# Patient Record
Sex: Male | Born: 1935 | Race: White | Hispanic: No | Smoking: Former smoker
Health system: Southern US, Community
[De-identification: ages and names within clinical notes are randomized; demographics above are authoritative.]

## PROBLEM LIST (undated history)

## (undated) DIAGNOSIS — I10 Essential (primary) hypertension: Secondary | ICD-10-CM

## (undated) DIAGNOSIS — I214 Non-ST elevation (NSTEMI) myocardial infarction: Secondary | ICD-10-CM

## (undated) DIAGNOSIS — J45909 Unspecified asthma, uncomplicated: Secondary | ICD-10-CM

## (undated) DIAGNOSIS — I25810 Atherosclerosis of coronary artery bypass graft(s) without angina pectoris: Secondary | ICD-10-CM

## (undated) DIAGNOSIS — I252 Old myocardial infarction: Secondary | ICD-10-CM

## (undated) DIAGNOSIS — K219 Gastro-esophageal reflux disease without esophagitis: Secondary | ICD-10-CM

## (undated) DIAGNOSIS — E785 Hyperlipidemia, unspecified: Secondary | ICD-10-CM

## (undated) HISTORY — DX: Unspecified asthma, uncomplicated: J45.909

## (undated) HISTORY — PX: CORONARY ARTERY BYPASS GRAFT: SHX141

## (undated) HISTORY — DX: Hyperlipidemia, unspecified: E78.5

## (undated) HISTORY — DX: Non-ST elevation (NSTEMI) myocardial infarction: I21.4

## (undated) HISTORY — DX: Essential (primary) hypertension: I10

## (undated) HISTORY — PX: CHOLECYSTECTOMY: SHX55

## (undated) HISTORY — DX: Atherosclerosis of coronary artery bypass graft(s) without angina pectoris: I25.810

## (undated) HISTORY — DX: Old myocardial infarction: I25.2

## (undated) HISTORY — DX: Gastro-esophageal reflux disease without esophagitis: K21.9

---

## 2000-02-21 ENCOUNTER — Encounter: Payer: Self-pay | Admitting: General Surgery

## 2000-02-21 ENCOUNTER — Encounter: Admission: RE | Admit: 2000-02-21 | Discharge: 2000-02-21 | Payer: Self-pay | Admitting: General Surgery

## 2000-02-22 ENCOUNTER — Ambulatory Visit (HOSPITAL_BASED_OUTPATIENT_CLINIC_OR_DEPARTMENT_OTHER): Admission: RE | Admit: 2000-02-22 | Discharge: 2000-02-22 | Payer: Self-pay | Admitting: General Surgery

## 2007-03-28 DIAGNOSIS — I214 Non-ST elevation (NSTEMI) myocardial infarction: Secondary | ICD-10-CM

## 2007-03-28 HISTORY — DX: Non-ST elevation (NSTEMI) myocardial infarction: I21.4

## 2008-08-10 DIAGNOSIS — E785 Hyperlipidemia, unspecified: Secondary | ICD-10-CM

## 2008-08-10 DIAGNOSIS — J45909 Unspecified asthma, uncomplicated: Secondary | ICD-10-CM | POA: Insufficient documentation

## 2008-08-10 DIAGNOSIS — I252 Old myocardial infarction: Secondary | ICD-10-CM | POA: Insufficient documentation

## 2008-08-11 DIAGNOSIS — I25810 Atherosclerosis of coronary artery bypass graft(s) without angina pectoris: Secondary | ICD-10-CM | POA: Insufficient documentation

## 2008-08-11 DIAGNOSIS — I10 Essential (primary) hypertension: Secondary | ICD-10-CM

## 2008-08-12 ENCOUNTER — Ambulatory Visit: Payer: Self-pay | Admitting: Cardiology

## 2008-08-12 LAB — CONVERTED CEMR LAB
BUN: 17 mg/dL (ref 6–23)
Basophils Absolute: 0 10*3/uL (ref 0.0–0.1)
CO2: 29 meq/L (ref 19–32)
Calcium: 9.2 mg/dL (ref 8.4–10.5)
Creatinine, Ser: 0.8 mg/dL (ref 0.4–1.5)
Eosinophils Absolute: 0.2 10*3/uL (ref 0.0–0.7)
GFR calc non Af Amer: 100.13 mL/min (ref >60)
Glucose, Bld: 122 mg/dL — ABNORMAL HIGH (ref 70–99)
Hemoglobin: 16.5 g/dL (ref 13.0–17.0)
Lymphocytes Relative: 16.2 % (ref 12.0–46.0)
Lymphs Abs: 1 10*3/uL (ref 0.7–4.0)
MCHC: 34.5 g/dL (ref 30.0–36.0)
Monocytes Relative: 7.7 % (ref 3.0–12.0)
Neutro Abs: 4.2 10*3/uL (ref 1.4–7.7)
Platelets: 144 10*3/uL — ABNORMAL LOW (ref 150.0–400.0)
Prothrombin Time: 11.1 s (ref 10.9–13.3)
RDW: 12.2 % (ref 11.5–14.6)
Sodium: 142 meq/L (ref 135–145)
aPTT: 28 s (ref 21.7–28.8)

## 2008-08-13 ENCOUNTER — Ambulatory Visit: Payer: Self-pay | Admitting: Surgery

## 2008-08-13 ENCOUNTER — Inpatient Hospital Stay (HOSPITAL_BASED_OUTPATIENT_CLINIC_OR_DEPARTMENT_OTHER): Admission: RE | Admit: 2008-08-13 | Discharge: 2008-08-13 | Payer: Self-pay | Admitting: Cardiology

## 2008-08-13 ENCOUNTER — Ambulatory Visit: Payer: Self-pay | Admitting: Cardiology

## 2008-08-19 ENCOUNTER — Encounter: Payer: Self-pay | Admitting: Surgery

## 2008-08-21 ENCOUNTER — Ambulatory Visit: Payer: Self-pay | Admitting: Surgery

## 2008-08-21 ENCOUNTER — Ambulatory Visit: Payer: Self-pay | Admitting: Cardiology

## 2008-08-21 ENCOUNTER — Inpatient Hospital Stay (HOSPITAL_COMMUNITY): Admission: RE | Admit: 2008-08-21 | Discharge: 2008-08-26 | Payer: Self-pay | Admitting: Surgery

## 2008-09-14 ENCOUNTER — Encounter: Payer: Self-pay | Admitting: Cardiology

## 2008-09-14 ENCOUNTER — Ambulatory Visit: Payer: Self-pay | Admitting: Surgery

## 2008-09-14 ENCOUNTER — Encounter: Admission: RE | Admit: 2008-09-14 | Discharge: 2008-09-14 | Payer: Self-pay | Admitting: Surgery

## 2008-10-12 ENCOUNTER — Ambulatory Visit: Payer: Self-pay | Admitting: Cardiology

## 2010-07-04 LAB — BASIC METABOLIC PANEL
BUN: 16 mg/dL (ref 6–23)
CO2: 31 mEq/L (ref 19–32)
Calcium: 8.9 mg/dL (ref 8.4–10.5)
Chloride: 104 mEq/L (ref 96–112)
Creatinine, Ser: 0.76 mg/dL (ref 0.4–1.5)

## 2010-07-04 LAB — CBC
MCHC: 34.3 g/dL (ref 30.0–36.0)
MCV: 89.5 fL (ref 78.0–100.0)
Platelets: 145 10*3/uL — ABNORMAL LOW (ref 150–400)

## 2010-07-05 LAB — BLOOD GAS, ARTERIAL
Drawn by: 313941
FIO2: 0.21 %
O2 Saturation: 98.1 %
Patient temperature: 98.6
pH, Arterial: 7.413 (ref 7.350–7.450)

## 2010-07-05 LAB — PROTIME-INR: Prothrombin Time: 16.4 seconds — ABNORMAL HIGH (ref 11.6–15.2)

## 2010-07-05 LAB — HEPATIC FUNCTION PANEL
ALT: 18 U/L (ref 0–53)
AST: 26 U/L (ref 0–37)
Albumin: 3.4 g/dL — ABNORMAL LOW (ref 3.5–5.2)
Alkaline Phosphatase: 48 U/L (ref 39–117)
Bilirubin, Direct: 0.3 mg/dL (ref 0.0–0.3)
Indirect Bilirubin: 0.7 mg/dL (ref 0.3–0.9)
Total Bilirubin: 1 mg/dL (ref 0.3–1.2)
Total Protein: 5.6 g/dL — ABNORMAL LOW (ref 6.0–8.3)

## 2010-07-05 LAB — BASIC METABOLIC PANEL
BUN: 12 mg/dL (ref 6–23)
BUN: 12 mg/dL (ref 6–23)
Calcium: 9.1 mg/dL (ref 8.4–10.5)
Chloride: 104 mEq/L (ref 96–112)
Creatinine, Ser: 0.74 mg/dL (ref 0.4–1.5)
Creatinine, Ser: 0.78 mg/dL (ref 0.4–1.5)
GFR calc non Af Amer: >60 mL/min (ref >60)
GFR calc non Af Amer: >60 mL/min (ref >60)
Glucose, Bld: 109 mg/dL — ABNORMAL HIGH (ref 70–99)
Potassium: 4.9 mEq/L (ref 3.5–5.1)

## 2010-07-05 LAB — TYPE AND SCREEN

## 2010-07-05 LAB — URINALYSIS, ROUTINE W REFLEX MICROSCOPIC
Ketones, ur: NEGATIVE mg/dL
Nitrite: NEGATIVE
Protein, ur: NEGATIVE mg/dL
pH: 7 (ref 5.0–8.0)

## 2010-07-05 LAB — POCT I-STAT 3, ART BLOOD GAS (G3+)
Acid-base deficit: 1 mmol/L (ref 0.0–2.0)
Bicarbonate: 22.2 mEq/L (ref 20.0–24.0)
O2 Saturation: 100 %
O2 Saturation: 98 %
O2 Saturation: 99 %
Patient temperature: 35.9
Patient temperature: 36.5
TCO2: 23 mmol/L (ref 0–100)
TCO2: 26 mmol/L (ref 0–100)
TCO2: 29 mmol/L (ref 0–100)
pCO2 arterial: 40.6 mmHg (ref 35.0–45.0)
pO2, Arterial: 131 mmHg — ABNORMAL HIGH (ref 80.0–100.0)

## 2010-07-05 LAB — CBC
HCT: 38.2 % — ABNORMAL LOW (ref 39.0–52.0)
HCT: 39.2 % (ref 39.0–52.0)
HCT: 44.8 % (ref 39.0–52.0)
Hemoglobin: 13.4 g/dL (ref 13.0–17.0)
MCHC: 34.1 g/dL (ref 30.0–36.0)
MCV: 89.7 fL (ref 78.0–100.0)
MCV: 89.9 fL (ref 78.0–100.0)
MCV: 90.5 fL (ref 78.0–100.0)
MCV: 90.6 fL (ref 78.0–100.0)
Platelets: 104 10*3/uL — ABNORMAL LOW (ref 150–400)
Platelets: 111 10*3/uL — ABNORMAL LOW (ref 150–400)
Platelets: 168 10*3/uL (ref 150–400)
Platelets: 97 10*3/uL — ABNORMAL LOW (ref 150–400)
RBC: 4.32 MIL/uL (ref 4.22–5.81)
RBC: 5 MIL/uL (ref 4.22–5.81)
RDW: 12.5 % (ref 11.5–15.5)
RDW: 12.6 % (ref 11.5–15.5)
RDW: 12.7 % (ref 11.5–15.5)
WBC: 10.1 10*3/uL (ref 4.0–10.5)
WBC: 11 10*3/uL — ABNORMAL HIGH (ref 4.0–10.5)
WBC: 12.3 10*3/uL — ABNORMAL HIGH (ref 4.0–10.5)

## 2010-07-05 LAB — GLUCOSE, CAPILLARY
Glucose-Capillary: 107 mg/dL — ABNORMAL HIGH (ref 70–99)
Glucose-Capillary: 112 mg/dL — ABNORMAL HIGH (ref 70–99)
Glucose-Capillary: 122 mg/dL — ABNORMAL HIGH (ref 70–99)
Glucose-Capillary: 91 mg/dL (ref 70–99)
Glucose-Capillary: 98 mg/dL (ref 70–99)

## 2010-07-05 LAB — POCT I-STAT, CHEM 8
BUN: 13 mg/dL (ref 6–23)
Calcium, Ion: 1.06 mmol/L — ABNORMAL LOW (ref 1.12–1.32)
Chloride: 109 mEq/L (ref 96–112)
HCT: 38 % — ABNORMAL LOW (ref 39.0–52.0)
Potassium: 4.5 mEq/L (ref 3.5–5.1)
Sodium: 141 mEq/L (ref 135–145)

## 2010-07-05 LAB — CREATININE, SERUM
Creatinine, Ser: 0.71 mg/dL (ref 0.4–1.5)
Creatinine, Ser: 0.87 mg/dL (ref 0.4–1.5)
GFR calc Af Amer: >60 mL/min (ref >60)
GFR calc non Af Amer: >60 mL/min (ref >60)

## 2010-07-05 LAB — POCT I-STAT 4, (NA,K, GLUC, HGB,HCT)
Glucose, Bld: 106 mg/dL — ABNORMAL HIGH (ref 70–99)
Glucose, Bld: 113 mg/dL — ABNORMAL HIGH (ref 70–99)
Glucose, Bld: 130 mg/dL — ABNORMAL HIGH (ref 70–99)
Glucose, Bld: 99 mg/dL (ref 70–99)
HCT: 34 % — ABNORMAL LOW (ref 39.0–52.0)
HCT: 43 % (ref 39.0–52.0)
HCT: 48 % (ref 39.0–52.0)
Hemoglobin: 11.2 g/dL — ABNORMAL LOW (ref 13.0–17.0)
Hemoglobin: 16.3 g/dL (ref 13.0–17.0)
Potassium: 3.9 mEq/L (ref 3.5–5.1)
Potassium: 4.2 mEq/L (ref 3.5–5.1)
Sodium: 141 mEq/L (ref 135–145)
Sodium: 142 mEq/L (ref 135–145)

## 2010-07-05 LAB — MAGNESIUM
Magnesium: 2 mg/dL (ref 1.5–2.5)
Magnesium: 2.2 mg/dL (ref 1.5–2.5)

## 2010-07-05 LAB — COMPREHENSIVE METABOLIC PANEL
ALT: 18 U/L (ref 0–53)
AST: 22 U/L (ref 0–37)
Albumin: 4 g/dL (ref 3.5–5.2)
Alkaline Phosphatase: 74 U/L (ref 39–117)
BUN: 12 mg/dL (ref 6–23)
GFR calc Af Amer: >60 mL/min (ref >60)
Potassium: 4.8 mEq/L (ref 3.5–5.1)
Sodium: 139 mEq/L (ref 135–145)
Total Protein: 6.8 g/dL (ref 6.0–8.3)

## 2010-07-05 LAB — AMYLASE: Amylase: 31 U/L (ref 27–131)

## 2010-07-05 LAB — APTT
aPTT: 26 seconds (ref 24–37)
aPTT: 33 seconds (ref 24–37)

## 2010-07-05 LAB — HEMOGLOBIN AND HEMATOCRIT, BLOOD
HCT: 36.1 % — ABNORMAL LOW (ref 39.0–52.0)
Hemoglobin: 12 g/dL — ABNORMAL LOW (ref 13.0–17.0)

## 2010-07-05 LAB — LIPASE, BLOOD: Lipase: 15 U/L (ref 11–59)

## 2010-07-05 LAB — HEMOGLOBIN A1C: Mean Plasma Glucose: 120 mg/dL

## 2010-07-19 ENCOUNTER — Encounter (INDEPENDENT_AMBULATORY_CARE_PROVIDER_SITE_OTHER): Payer: Self-pay

## 2010-07-19 DIAGNOSIS — R0989 Other specified symptoms and signs involving the circulatory and respiratory systems: Secondary | ICD-10-CM

## 2010-08-09 NOTE — Consult Note (Signed)
NEW PATIENT CONSULTATION   Ricardo Reed, Ricardo Reed  DOB:  12/16/1931                                        Aug 13, 2008  CHART #:  [id number]   REASON FOR CONSULTATION:  Left main and severe native three-vessel  coronary artery disease with saphenous vein graft occlusions and  progressive angina.   CLINICAL HISTORY:  I was asked by Dr. Juanda Chance to evaluate the patient for  consideration of redo coronary artery bypass graft surgery.  He is a 77-  year-old gentleman in good overall health, who underwent coronary artery  bypass surgery in [place], [place] in 1981.  The exact details of  that surgery are not clear to me, but he did have 3 vein grafts placed  and one of them was to the LAD.  He was doing well until March 2009 when  he suffered a non-ST segment elevation MI.  Cardiac catheterization at  that time showed patent vein graft to the LAD with 20-30% stenosis and  other 2 vein grafts were occluded.  The native left circumflex was  occluded with the distal vessel filling by collaterals.  His right  coronary artery was a nondominant vessel.  He said that he has had  occasional episodes of chest discomfort over the past year and then he  underwent a stress test in March 2010 that showed significant ST and T-  wave changes as well as inferior scar and inferolateral scar with some  periscar ischemia.  He denied any chest pain with stress test.  He said  that since the stress test, he has noticed increasing symptoms of  substernal chest discomfort radiating towards the right shoulder and arm  with some numbness.  He also has had some indigestion-type symptoms.  He  said these symptoms always occur with exertion, usually when he is  walking his 2-mile walk in the morning.  They usually occur after he  walks a couple of 100 yards and then he slows down and they resolve  after a few minutes, and then he can continue walking and finishing the  2-mile walk.  He has noticed  some decrease in his energy level.  He has  had no symptoms at rest.  He underwent cardiac catheterization today by  Dr. Juanda Chance, which I reviewed.  This shows diffuse left main disease  probably with 70-80% stenosis.  The LAD is occluded proximally at the  septal perforator with filling of the distal LAD by vein graft.  This  vein graft is diffusely diseased, but only has about 30% stenosis at the  ostium and another lesion in the midportion.  The LAD is a large vessel  that rest around the apex.  There is a large intermediate vessel  supplied by the left main.  The left circumflex is occluded proximally  with filling of a marginal vessel, a moderate-sized by bridging  collaterals.  The right coronary artery was a small nondominant vessel  that was occluded proximally with very faint filling of some tiny  branches distally.  Left ventricular ejection fraction is probably about  50% with inferior akinesis.  There is no significant mitral  regurgitation.  There does appear to be a small amount of calcium in the  aortic root as well as some calcification in the lateral pericardium.   REVIEW OF SYSTEMS:  His  review of systems is as follows.  General:  He  denies any fever or chills.  He has had no recent weight changes.  He  has had some fatigue.  Eyes:  Negative.  ENT:  Negative.  Endocrine:  He  denies diabetes and hypothyroidism.  Cardiovascular:  As above.  He  denies exertional dyspnea.  He has had no PND or orthopnea.  He denies  palpitations and peripheral edema.  Respiratory:  He does have a history  of asthma and wheezing.  He denies any cough or sputum production.  GI:  He has had no nausea or vomiting.  He denies melena and bright red blood  per rectum.  GU:  Denies dysuria and hematuria.  Vascular:  Denies  claudication and phlebitis.  Neurological:  He denies any focal weakness  or numbness.  He denies dizziness and syncope.  He has never had TIA or  stroke.  Musculoskeletal:  He  denies arthralgias and myalgias.  Psychiatric:  Negative.  Hematological:  Negative.   ALLERGIES:  None.   His medications are Proventil 108 mcg p.r.n., Prilosec OTC 20 mg daily,  pravastatin 10 mg nightly, Flonase 50 mcg daily, lisinopril 5 mg daily,  aspirin 81 mg daily, Imdur 30 mg daily, Benadryl nightly p.r.n., Asmanex  inhaler, and Aleve p.r.n.   His past medical history is significant for hypertension.  He has a  history of coronary artery disease as mentioned above.  There is no  history of diabetes.  His previous surgical history is significant for  TURP in the past and cholecystectomy in the past.   SOCIAL HISTORY:  He is widowed and has 3 adult children.  He is retired  from [employer].  He is a previous heavy smoker, but quit  in 1981.  He denies alcohol abuse.   FAMILY HISTORY:  Positive for cardiac disease.  His brother had heart  attack at 49 and his mother had heart attack at older age.   PHYSICAL EXAMINATION:  VITAL SIGNS:  His blood pressure is 171/78.  His  pulse is 52 and regular.  Respiratory rate is 18 and unlabored.  Oxygen  saturation on room air is 95%.  GENERAL:  He is a well-developed, elderly gentleman in no distress.  HEENT:  Normocephalic and atraumatic.  Pupils are equal and reactive to  light and accommodation.  Extraocular muscles are intact.  Throat is  clear.  NECK:  Normal carotid pulses bilaterally.  There are no bruits.  There  is no adenopathy or thyromegaly.  CARDIAC:  Regular rate and rhythm with normal S1 and S2.  There is no  murmur, rub, or gallop.  There is a well healed sternotomy scar.  The  sternum is stable.  LUNGS:  Clear.  ABDOMEN:  Active bowel sounds.  Abdomen is soft, flat, and nontender.  There are no palpable masses or organomegaly.  EXTREMITIES:  No peripheral edema.  Pedal pulses are palpable  bilaterally.  There is a saphenous vein harvest incision from the right  ankle to the mid thigh.  NEUROLOGIC:  He  is alert and oriented x3.  Motor and sensory exams  grossly normal.   IMPRESSION:  The patient has severe left main and native three-vessel  coronary artery disease with a patent saphenous vein graft to the left  anterior descending with mild stenosis, but diffuse disease as well as  two other occluded vein grafts.  He has worsening symptoms of exertional  angina and fatigue as  well as abnormal stress test showing inferior and  inferolateral scar and peri-infarct ischemia.  Given the above findings,  I think the best option for him is redo coronary artery bypass graft  surgery to prevent further ischemia and infarction, and improve his  quality of life.  I discussed the operative procedure with the patient  and his brother Dr. Lynnette Caffey, who is a retired Marine scientist from [employer].  I discussed alternatives, benefits, and risks including but not  limited to bleeding, blood transfusion, infection, stroke, myocardial  infarction, graft failure, and death.  He understands and agrees to  proceed.  We will plan to do this on Friday, Aug 21, 2008.   Evelene Croon, M.D.  Electronically Signed   BB/MEDQ  D:  08/13/2008  T:  08/14/2008  Job:  [id number]   cc:   Everardo Beals. Juanda Chance, MD, [facility]  Bearl Mulberry, MD

## 2010-08-09 NOTE — Op Note (Signed)
NAME:  Ricardo Reed, Ricardo Reed NO.:  [id number]   MEDICAL RECORD NO.:  [id number]          PATIENT TYPE:  INP   LOCATION:  2301                         FACILITY:  [facility]   PHYSICIAN:  Evelene Croon, M.D.     DATE OF BIRTH:  Nov 08, 1931   DATE OF PROCEDURE:  08/21/2008  DATE OF DISCHARGE:                               OPERATIVE REPORT   PREOPERATIVE DIAGNOSES:  Severe native three-vessel coronary artery  disease and saphenous vein graft disease, status post coronary artery  bypass graft surgery in 1981.   POSTOPERATIVE DIAGNOSIS:  Severe native three-vessel coronary artery  disease and saphenous vein graft disease, status post coronary artery  bypass graft surgery in 1981.   OPERATIVE PROCEDURES:  Redo median sternotomy, extracorporeal  circulation, redo coronary artery bypass graft surgery x3 using a left  internal mammary artery graft to the left anterior descending coronary  artery, with a saphenous vein graft to the diagonal branch of the left  anterior descending, and a saphenous vein graft to the obtuse marginal  branch of the left circumflex coronary artery.  Endoscopic vein  harvesting from the left leg.   ATTENDING SURGEON:  Evelene Croon, MD   ASSISTANT:  Stephanie Acre. Dasovich, PA-C   ANESTHESIA:  General endotracheal.   CLINICAL HISTORY:  This patient is a 77 year old gentleman, who  underwent coronary artery bypass surgery in [place], [place] in 1981.  The exact details of that surgery were not clear, but he was reported to  have vein graft to the LAD, vein graft to the diagonal, vein graft to  the obtuse marginal, and the vein graft to the right coronary artery.  He was doing well until March 2009, when he suffered a non-ST-segment  elevation MI.  Cardiac catheterization at that time showed a patent vein  graft to the LAD with 20-30% stenosis and the other 2 vein grafts were  occluded.  The native left circumflex was occluded with the distal  vessel  filling by collaterals.  His right coronary is nondominant  vessel.  He reported occasional episodes of chest discomfort over the  past year and then underwent a stress test in March 2010 that showed  significant ST and T wave changes as well as inferior scar and  inferolateral scar with some peri-scarring ischemia.  He said that since  that test, he has noticed increasing symptoms of substernal chest  discomfort radiating towards the right shoulder and arm with some  numbness.  He has also had some indigestion-type symptoms typically  occurring with exertion.  He underwent cardiac catheterization by Dr.  Juanda Chance on May 20, which showed diffuse left main disease with 70-80%  stenosis.  The LAD was occluded proximally and septal perforator with  filling of the distal LAD by a vein graft.  This vein graft was patent,  but diffusely diseased.  It was reported that there was about 30%  stenosis at the ostium, but I think this was probably more like 50-60%.  There is another insignificant lesion in the midportion.  This vein  graft was diffusely diseased and calcified.  The  LAD was a large vessel  that wrapped around the apex.  There was a large intermediate or high  diagonal branch that was supplied by the left main.  The left circumflex  was occluded proximally with filling of a distal marginal branch that  was moderate size by bridging collaterals.  The right coronary artery  was a small nondominant vessel that was occluded beyond the acute  marginal branches with faint filling of the distal vessel.  Left  ventricular ejection fraction was about 50% with inferior akinesis.  There was some calcification noted laterally, which was felt to probably  in the pericardium.  There is no significant mitral regurgitation.  There is small amount of calcium in the aortic root.  After review of  the catheterization and examination of the patient, I felt that coronary  artery bypass surgery was probably  the best option to prevent further  ischemia and infarction and improve his symptoms.  I discussed the  operative procedure with the patient and his brother.  We discussed  alternatives, benefits, and risks including but not limited to bleeding,  blood transfusion, infection, stroke, myocardial infarction, graft  failure, and death.  He understood all this and agreed to proceed.   OPERATIVE PROCEDURE:  The patient was taken to the operating room and  placed on the table in supine position.  After induction of general  endotracheal anesthesia, a Foley catheter was placed in bladder using  sterile technique.  Then, the chest, abdomen, and both lower extremities  were prepped and draped in usual sterile manner.  The chest was entered  through a median sternotomy incision and the sternal wires were removed.  The sternum was opened using oscillating saw without difficulty.  Bone  hooks were used to retract the sternum and the pericardium and heart  were dissected from back of the sternum.  Then, the left internal  mammary artery was harvested from the chest wall as a pedicle graft.  This was a medium-caliber vessel with excellent blood flow through it.  At the same time, segment of greater saphenous vein was harvested from  the left leg using endoscopic vein harvest technique.  This vein was a  medium size and good quality.   Then, dissection was performed to expose the right atrium and ascending  aorta.  The old vein grafts were identified and not manipulated.  When  an adequate activated clotting time was achieved, the distal ascending  aorta was cannulated using a 20-French aortic cannula for arterial  inflow.  Venous outflow was achieved using a 2-stage venous cannula  through the right atrium.  An antegrade cardioplegia and vent cannula  was inserted into the aortic root.   The patient was placed on cardiopulmonary bypass and the remainder of  the heart was dissected free from the  pericardium.  The distal  coronaries were identified.  The LAD was a large vessel, but was  diffusely diseased with calcific plaque, and this extended out to the  apex of the heart.  There was one area that was soft enough to open.  The high diagonal or intermediate vessel was a medium-sized vessel that  was suitable for grafting.  The obtuse marginal vessel was small, but  graftable.  This vessel was diffusely diseased in its proximal and  midportion and I had to go out into the distal vessel in order to be  able to open it.  The calcified plaque that was felt to be in the  pericardium  on catheterization was actually within the myocardium just  inferolaterally to this obtuse marginal branch.  I suspected it must  have been due to calcification of a previous infarct.  The right  coronary artery was diffusely diseased with calcific plaque.  The distal  vessel was not graftable.  There was a tiny posterior descending branch.  The acute marginal branches were small nongraftable vessels.   Then, the aorta was cross-clamped and 600 mL of cold blood antegrade  cardioplegia was administered in the aortic root with quick arrest of  the heart.  Systemic hypothermia to 20 degrees centigrade and topical  hypothermic with iced saline was used.  A temperature probe was placed  in the septum and an insulating pad in the pericardium.   First distal anastomosis was performed to the obtuse marginal branch.  The internal diameter distally was about 1.25 mm.  The conduit used was  a segment of greater saphenous vein and the anastomosis was performed in  end-to-side manner using continuous 7-0 Prolene suture.  Flow was noted  through the graft and was fair.  Then, dose of cardioplegia was given  down the vein graft.   The second distal anastomosis was performed to the diagonal branch.  The  internal diameter was about 1.6 mm.  The conduit used was a second  segment of greater saphenous vein and the  anastomosis performed in end-  to-side manner using continuous 7-0 Prolene suture.  Flow was noted  through the graft and was excellent.   Third distal anastomosis was performed to the distal LAD.  The internal  diameter here was about 1.6 mm.  The conduit used was a left internal  mammary graft and this was brought through an opening of left  pericardium anterior to the phrenic nerve.  It was anastomosed to the  LAD in end-to-side manner using continuous 8-0 Prolene suture.  The  pedicle was sutured to the epicardium with 6-0 Prolene sutures.   The vein graft of the LAD was diffusely calcified.  The hood of the vein  graft was completely calcified and there was no area of the graft that  was soft enough to allow ligation.  I did not feel I would be able to  ligate this graft even at the aortic anastomosis due to the calcified  vein graft hood.  Therefore, I decided to leave the vein graft intact.  With cross-clamp in place, the two proximal vein graft anastomoses were  performed.  The vein graft to the diagonal branch was performed to the  hood of the old right coronary vein graft, which was soft.  This was  performed in end-to-side manner using continuous 6-0 Prolene suture.  The vein graft to the obtuse marginal was performed directly to the  ascending aorta in end-to-side manner using continuous 6-0 Prolene  suture.  Then, the clamp was removed from mammary pedicle.  There was  rapid warming of the ventricular septum and return of spontaneous  ventricular fibrillation.  The cross-clamp removal time 97 minutes, and  the patient spontaneously converted to sinus rhythm.  The proximal and  distal anastomoses appeared hemostatic and allowed the grafts  satisfactory.  Graft markers were placed around the proximal  anastomoses.  Two temporary right ventricular and right atrial pacing  wires were placed and brought out through the skin.   When the patient rewarmed to 37 degrees centigrade,  he was weaned from  cardiopulmonary bypass on low-dose dopamine.  Total bypass time was 117  minutes.  Cardiac function appeared excellent with a cardiac output of 5  L/min.  Protamine was given and the venous and aortic cannulas were  removed without difficulty.  Hemostasis was achieved.  Four chest tubes  were placed with bilateral pleural tubes with a tube in the posterior  pericardium and one in the anterior mediastinum.  The pericardium could  not be closed over the heart.  The sternum was closed with #6 stainless  steel wires.  The fascia was closed with continuous #1 Vicryl suture.  Subcutaneous tissue was closed with continuous 2-0 Vicryl and the skin  with a 3-0 Vicryl subcuticular closure.  Lower extremity vein harvest  site was closed in layers in a similar manner.  The sponge, needle, and  instrument counts were correct according to the scrub nurse.  Dry  sterile dressing was applied over the incisions around the chest tubes,  which were hooked to Pleur-Evac suction.  The patient remained  hemodynamically stable and was transported to the SICU in guarded, but  stable condition.       Evelene Croon, M.D.  Electronically Signed     BB/MEDQ  D:  08/21/2008  T:  08/22/2008  Job:  [id number]   cc:   Everardo Beals. Juanda Chance, MD, [facility]

## 2010-08-09 NOTE — Assessment & Plan Note (Signed)
OFFICE VISIT   Ricardo Reed, Ricardo Reed  DOB:  06-09-1931                                        September 14, 2008  CHART #:  [id number]   REASON FOR OFFICE VISIT:  Routine followup, status post redo sternotomy,  CABG x3 on Aug 21, 2008.   HISTORY OF PRESENT ILLNESS:  This is a pleasant 77 year old Caucasian  male with a known medical history of coronary artery disease (status  post CABG in 1981), history of hypertension, history of hyperlipidemia,  who was doing fairly well until March 2009.  He then suffered a non-  STEMI.  Cardiac catheterization at that time showed patent vein graft to  the LAD with approximately 20-30% stenosis and that the other two vein  grafts were occluded.  He then underwent a stress test on March 2010  which showed significant ST and T-wave changes as well as an  inferolateral scar with periscar ischemia.  Cardiac catheterization that  was done revealed multivessel coronary artery disease.  The patient was  admitted to [facility] on Aug 21, 2008, to undergo redo sternotomy, CABG  x3 (LIMA to LAD, SVG to diagonal branch, SVG to obtuse marginal) by Dr.  Laneta Simmers.  The patient has been doing very well since having been  discharge from the hospital on August 26, 2008.  He has occasional fatigue.  Denies any shortness of breath or chest pain.   PHYSICAL EXAMINATION:  VITAL SIGNS:  BP is 147/74, pulse rate 66,  respirations 18, and O2 sat 98% on room air.  GENERAL:  This is a pleasant 77 year old Caucasian male in no acute  distress who is alert, oriented, and cooperative.  CARDIOVASCULAR:  Regular rate and rhythm.  S1 and S2.  No murmurs,  gallops, or rubs.  PULMONARY:  Clear to auscultation bilaterally.  No rales, wheezes, or  rhonchi.  ABDOMEN:  Soft and nontender.  Bowel sounds present.  EXTREMITIES:  No lower extremity edema.  SKIN:  Wounds, sternum is solid.  Wound is well healed.  One scab  removed from previous chest tube site.  Left lower  extremity wound with  a scab this was removed and cleansed was normal saline.  There is no  erythema, cellulitis, or discharge.   Chest x-ray done today showed no pneumothorax or significant pleural  effusions.   IMPRESSION AND PLAN:  1. Overall, the patient is surgically stable, status post redo      sternotomy and coronary artery bypass graft x3.  2. The patient was instructed he may begin driving short distances.  3. The patient was instructed to continue with sternal precautions for      at least 3-4 more weeks (no lifting more than 10-15 pounds).  He      was also instructed that he may slowly begin using his riding lawn      mower, but that he is not to use a push mower yet or weed trimmer.  4. The patient states his Lopressor was stopped and he was started on      atenolol.  He states that he takes atenolol 25 mg p.o. twice daily      but needs additional prescription this was given.  5. The patient instructed if he has any fever, chills, discharge,      tenderness, or erythema to the left lower  extremity wound, he is to      contact the office immediately.  6. The patient is going to see Dr. Juanda Chance for followup in July and he      will return to see Dr. Laneta Simmers on a p.r.n. basis.   Evelene Croon, M.D.  Electronically Signed   DZ/MEDQ  D:  09/14/2008  T:  09/15/2008  Job:  [id number]   cc:   Everardo Beals. Juanda Chance, MD, [facility]

## 2010-08-09 NOTE — Discharge Summary (Signed)
NAME:  Ricardo Reed, Ricardo Reed NO.:  [id number]   MEDICAL RECORD NO.:  [id number]          PATIENT TYPE:  INP   LOCATION:  2017                         FACILITY:  [facility]   PHYSICIAN:  Evelene Croon, M.D.     DATE OF BIRTH:  1932-03-15   DATE OF ADMISSION:  08/21/2008  DATE OF DISCHARGE:  08/26/2008                               DISCHARGE SUMMARY   ADMITTING DIAGNOSES:  1. History of coronary artery disease (status post coronary artery      bypass graft in 1981).  2. History of stenting (March 2009)  3. History of hypertension.  4. History of hyperlipidemia.  5. History of remote tobacco use, (quit 1981).   DISCHARGE DIAGNOSES:  1. History of coronary artery disease (status post coronary artery      bypass graft in 1981).  2. History of stenting (March 2009)  3. History of hypertension.  4. History of hyperlipidemia.  5. History of remote tobacco use, (quit 1981).  6. Postoperative nausea and emesis.  7. Postoperative thrombocytopenia (last platelet count of 145,000).   PROCEDURES:  1. Cardiac catheterization performed on Aug 13, 2008, by Dr. Juanda Chance.  2. Redo sternotomy, coronary artery bypass graft x3 (left internal      mammary artery to left anterior descending, saphenous vein graft to      diagonal branch, saphenous vein graft to obtuse marginal with      endoscopic vein harvesting in the left leg by Dr. Laneta Simmers on Aug 21, 2008.   HISTORY OF PRESENT ILLNESS:  This is a 77 year old male with a past  medical history of coronary artery disease (status post CABG in 1981),  STEMI in March 2009, history of hypertension, history of hyperlipidemia  who had been doing fairly well until he suffered an acute myocardial  infarction in March 2009.  Cardiac catheterization at that time showed a  patent vein graft to the LAD with a 20-30% stenosis and the other 2 vein  grafts were occluded.  The native left circumflex was occluded with  distal vessel filling by  collaterals.  The patient reported occasional  episodes of chest discomfort over the past year.  He then underwent a  stress test in March 2010.  This showed significant ST and T-wave  changes, as well as inferior scar and inferolateral scar with some  periscar ischemia.  Since having had the stress test, the patient had  noticed increasing symptoms of substernal chest discomfort that radiated  towards his right shoulder and right arm with some numbness.  These  symptoms usually occurred with exertion and would slowly resolved after  a few minutes.  The patient underwent a cardiac catheterization on Aug 13, 2008, by Dr. Juanda Chance and was found to have multivessel coronary  artery disease.  This included a diffuse left main disease  (approximately 70-80% stenosis), LAD occluded proximally at the septal  perforator with filling of the distal LAD by vein graft.  This vein  graft was diffusely diseased and was found to have an approximate 30%  stenosis of the ostium and  another lesion in the midportion.  The left  circumflex was occluded proximally.  Left ventricular ejection fraction  was about 50% with inferior akinesis.  A cardiothoracic consultation was  obtained with Dr. Laneta Simmers.  The patient then underwent the aforementioned  redo sternotomy and CABG x3 on Aug 21, 2008.   BRIEF HOSPITAL COURSE STAY:  The patient was extubated early the evening  of surgery without difficulty.  He was initially AAI paced.  He remained  afebrile and hemodynamically stable.  A-line, Swan, and chest tubes were  removed early in the postoperative course.  Early on the morning of  postop day 2, however, the patient did develop intractable nauseousness  and some bilious vomiting.  He was given Zofran without much relief of  his symptoms.  A KUB was obtained which showed gas and stool throughout  the colon.  No evidence of obstruction or free air.  Amylase and lipase  were within normal limits, as well as bilirubin  and LFTs.  The patient  was transferred from the intensive care unit to [facility] for further  convalescence.  The patient's nauseousness and emesis did resolve.  He  began passing flatus and then had a bowel movement.  He continued to  improve such that currently on postop day #4, he is afebrile.  Vital  signs are stable.  Preoperative weight 76 kg, today's weight 72.8 kg.   PHYSICAL EXAMINATION:  CARDIOVASCULAR:  Regular rate and rhythm.  PULMONARY:  Clear to auscultation bilaterally.  ABDOMEN:  Benign.  EXTREMITIES:  No lower extremity edema.  WOUNDS:  Clean, dry, and continuing to heal.   The patient is continuing to progress with cardiac rehab.  He is  tolerating a diet.  Provided he remains afebrile and hemodynamically  stable, he will be discharged on August 26, 2008.   LATEST LABORATORY STUDIES:  CBC done on August 25, 2008, H and H 13.5 and  39.4 respectively, white count of 83, platelet count of 145,000.  Last  BMP done on this date, potassium 3.6, BUN and creatinine were 16 and  0.76 respectively.   Last chest x-ray done on Aug 23, 2008, showed bibasilar atelectasis, no  pulmonary edema, trace right apical pneumothorax.   DISCHARGE INSTRUCTIONS:  The patient is to remain on a low-fat, low-salt  diet.  He is not to drive or lift more than 10 pounds.  He is to  continue with his breathing exercise daily.  He is to walk and increase  frequency and duration as tolerated.  He may shower.  He is to cleanse  his wounds with mild soap and water.  He is to call the office if wound  problems arise.   FOLLOWUP APPOINTMENTS:  1. The patient needs to see Dr. Juanda Chance on September 03, 2008, at 4:15 p.m.  2. The patient is to see Dr. Laneta Simmers, physician assistant, on September 14, 2008, at 1:00 p.m.  Prior to this office appointment, a chest x-ray      will be obtained.   DISCHARGE MEDICATIONS:  1. Enteric-coated aspirin 81 mg p.o. daily.  2. Lopressor 12.5 mg p.o. 2 times daily.  3. Lisinopril 5 mg  p.o. daily.  4. Pravastatin 10 mg p.o. at bedtime.  5. Plavix 75 mg p.o. daily.  6. Omeprazole 20 mg p.o. daily.  7. Fluconazole nasal 0.05% previously as taken preoperatively.  8. Albuterol inhaler, dosage is as previously taken preoperatively.  9. Ultram 50 mg 1-2 tablets every 4-6  hours as needed for pain.      Doree Fudge, [place]      Evelene Croon, M.D.  Electronically Signed    DZ/MEDQ  D:  08/25/2008  T:  08/26/2008  Job:  [id number]   cc:   Everardo Beals. Juanda Chance, MD, [facility]

## 2010-08-09 NOTE — Cardiovascular Report (Signed)
NAME:  Ricardo Reed, Ricardo Reed NO.:  [id number]   MEDICAL RECORD NO.:  [id number]          PATIENT TYPE:  OIB   LOCATION:  1961                         FACILITY:  [facility]   PHYSICIAN:  Everardo Beals. Juanda Chance, MD, FACCDATE OF BIRTH:  05/30/1931   DATE OF PROCEDURE:  08/13/2008  DATE OF DISCHARGE:                            CARDIAC CATHETERIZATION   CLINICAL HISTORY:  Mr. Retz is 77 years old and had bypass surgery in  [place] in 1981.  In March 2009, he had a non-ST-elevation myocardial  infarction and was studied in [place] by Dr. Clearance Coots and his group and  found to have occluded vein graft to the circumflex and right coronary  system and a patent vein graft to the LAD.  Medical therapy was  recommended at that time.  He recently has developed increasing symptoms  of chest pain with exertion.  He had a stress test in March 2010, which  showed inferior scar and inferolateral scar with some superimposed  ischemia and he had ST-T changes with exercise.  I saw him yesterday in  consultation and we made a decision to bring him today for evaluation  with angiography.   PROCEDURE:  The procedure was performed via the right femoral artery  using arterial sheath and 4-French preformed coronary catheters.  A  front-wall arterial puncture was performed and Omnipaque contrast was  used.  A distal aortogram was performed to rule out aneurysm.  The  patient tolerated the procedure well and left the laboratory in  satisfactory condition.   RESULTS:  The aortic pressure was 179/66 with mean of 95.  Left  ventricular pressure was 139/20.   Left main coronary artery:  The left main coronary artery had a 50%  ostial stenosis.   Left anterior descending artery:  The left anterior descending artery  was diffusely diseased proximally with 70-80% narrowing before a first  moderate-sized diagonal branch.  The LAD was then occluded proximally  after the first septal perforator and the first  diagonal branch.   Circumflex artery:  The circumflex artery gave rise to a small marginal  branch and atrial branch and was completely occluded.  There was 80%  narrowing proximally after the small marginal branch.  The distal  circumflex artery filled via bridging collaterals.  This was a moderate-  sized vessel.  There was another small vessel that also filled via  collaterals.   Right coronary artery:  The right coronary artery was completely  occluded at its midportion.  There was also an 80% proximal stenosis.  There were two right ventricular branches before the complete occlusion.  Distal right coronary artery filled via collaterals from the left  coronary but was a very small vessel consisting of a small posterior  descending and small posterolateral branch.   The saphenous vein graft to the circumflex artery and right coronary  artery were completely occluded at their origin.   The saphenous vein graft to LAD had a 40% ostial stenosis and there was  30% proximal stenosis.  Distal LAD was a good caliber vessel that was  irregular but free of  major obstruction.   Left ventriculogram:  The left ventriculogram performed in the RAO  projection showed akinesis of the inferior wall.  The apex and  anterolateral moved well.  Estimated ejection fraction was 40%.   Distal aortogram:  A distal aortogram was performed which showed 30%  ostial left renal artery stenosis.  There were irregularities in the  aortoiliac but no major obstruction.   CONCLUSION:  1. Coronary artery disease status post coronary artery bypass graft      surgery in 1981.  2. Severe native vessel disease with 50% narrowing in the left main      coronary artery, 70-80% narrowing in the proximal left anterior      descending and total occlusion of the proximal left anterior      descending, 80% narrowing of the proximal circumflex artery and      total occlusion of mid circumflex artery, and total occlusion  of      the mid-right coronary artery.  3. Occluded vein grafts to the circumflex and right coronary artery      and 40-50% ostial narrowing and 30% proximal narrowing in the      saphenous vein graft to the left anterior descending.  4. Inferior wall akinesis with an estimated ejection fraction of 40%.   RECOMMENDATIONS:  The patient has two occluded vein grafts and there was  disease in the vein graft to LAD, although this does not appear to be  flow-limiting.  I think redo operation is probably the best treatment  option.  The main limitations are his distal targets that are somewhat  small but I think the distal circumflex artery and diagonal branch are  graftable.  A LIMA graft to LAD may have potential for atrophy with a  patent vein graft.  We plan consultation with Dr. Laneta Simmers.      Bruce Elvera Lennox Juanda Chance, MD, [facility]  Electronically Signed     BRB/MEDQ  D:  08/13/2008  T:  08/13/2008  Job:  [id number]   cc:   Evelene Croon, M.D.  Bearl Mulberry, M.D.  Ricardo Jericho, M.D.

## 2010-08-29 ENCOUNTER — Encounter: Payer: Self-pay | Admitting: Cardiology

## 2010-09-21 ENCOUNTER — Encounter (INDEPENDENT_AMBULATORY_CARE_PROVIDER_SITE_OTHER): Payer: BLUE CROSS/BLUE SHIELD

## 2010-09-21 ENCOUNTER — Ambulatory Visit (INDEPENDENT_AMBULATORY_CARE_PROVIDER_SITE_OTHER): Payer: Medicare Other | Admitting: Cardiology

## 2010-09-21 ENCOUNTER — Encounter: Payer: Self-pay | Admitting: Cardiology

## 2010-09-21 DIAGNOSIS — R0989 Other specified symptoms and signs involving the circulatory and respiratory systems: Secondary | ICD-10-CM

## 2010-09-21 DIAGNOSIS — I2510 Atherosclerotic heart disease of native coronary artery without angina pectoris: Secondary | ICD-10-CM

## 2010-09-21 DIAGNOSIS — E785 Hyperlipidemia, unspecified: Secondary | ICD-10-CM

## 2010-09-21 DIAGNOSIS — I10 Essential (primary) hypertension: Secondary | ICD-10-CM

## 2010-09-21 DIAGNOSIS — I25810 Atherosclerosis of coronary artery bypass graft(s) without angina pectoris: Secondary | ICD-10-CM

## 2010-09-21 MED ORDER — LISINOPRIL 10 MG PO TABS: 10.0000 mg | ORAL_TABLET | Freq: Every day | ORAL | Status: DC

## 2010-09-21 NOTE — Patient Instructions (Signed)
Increase Lisinopril to 10mg  daily.  Have lab done in 2 weeks--BMP you have the order. Please fax the results to [phone].  Take and record your blood pressure. I will call you in 2 weeks to get the readings. Luana Shu [phone]  Your physician wants you to follow-up in: 1 year with Dr Shirlee Latch. (June 2013). You will receive a reminder letter in the mail two months in advance. If you don't receive a letter, please call our office to schedule the follow-up appointment.

## 2010-09-22 NOTE — Progress Notes (Signed)
77 yo with history of CAD s/p CABG and redo CABG presents for cardiology followup.  He has seen Dr. Juanda Chance in the past and is seeing me for the first time today.  He had his redo CABG in 5/10.  Since that time, he has been doing well.  No exertional chest pain.  He walks every morning for about 2 miles.  No exertional dyspnea.  He does his yardwork with no problems.  BP is high today at 158/74.  He does report that he was nervous about this appointment and did not sleep well.   ECG: NSR at 51, LAE, LVH with repolarization abnormality.   PMH: 1. CAD: CABG in 1980s.  NSTEMI in 2009.  Redo CABG in 5/10 => LIMA-LAD, SVG-D, SVG-OM. 2. Asthma 3. HTN 4. Hyperlipidemia 5. GERD  SH: Widowed, lives in [place].  Has children.  Quit smoking around 1980.  Occasional ETOH.   FH: Parents and brother with CAD.   ROS: All systems reviewed and negative except as per HPI.   Current Outpatient Prescriptions  Medication Sig Dispense Refill  . albuterol (PROVENTIL HFA) 108 (90 BASE) MCG/ACT inhaler as needed.        [name] albuterol (PROVENTIL, VENTOLIN) (5 MG/ML) 0.5% NEBU Take by nebulization as directed.        [name] aspirin 81 MG tablet Take 81 mg by mouth daily.        [name] atenolol (TENORMIN) 100 MG tablet Take 50 mg by mouth daily.        [name] atorvastatin (LIPITOR) 20 MG tablet Take 20 mg by mouth daily.        . diphenhydrAMINE (BENADRYL) 50 MG tablet AT BEDTIME       . fluticasone (FLONASE) 50 MCG/ACT nasal spray DAILY       . naproxen sodium (ANAPROX) 220 MG tablet Take 220 mg by mouth daily.        . NON FORMULARY anacetropib or placebo 100 mg once daily       . omeprazole (PRILOSEC OTC) 20 MG tablet Take 20 mg by mouth daily.        [name] Respiratory Therapy Supplies (INHALER COMPANIONS) MISC ASAMAEX INHALER 1 TWIST AM       . tadalafil (CIALIS) 5 MG tablet Take 5 mg by mouth daily as needed.        [name] lisinopril (PRINIVIL,ZESTRIL) 10 MG tablet Take 1 tablet (10 mg total) by mouth daily.  90 tablet  3    BP  158/74  Pulse 51  Ht 5\' 9"  (1.753 m)  Wt 171 lb (77.565 kg)  BMI 25.25 kg/m2 General: NAD Neck: No JVD, no thyromegaly or thyroid nodule.  Lungs: Clear to auscultation bilaterally with normal respiratory effort. CV: Nondisplaced PMI.  Heart regular S1/S2, no S3/S4, no murmur.  No peripheral edema.  No carotid bruit.  Normal pedal pulses.  Abdomen: Soft, nontender, no hepatosplenomegaly, no distention.  Neurologic: Alert and oriented x 3.  Psych: Normal affect. Extremities: No clubbing or cyanosis.

## 2010-09-22 NOTE — Assessment & Plan Note (Signed)
No ischemic symptoms.  Good exercise tolerance.  Continue ASA, beta blocker, statin (on Lipitor 20 mg daily), and ACEI.

## 2010-09-22 NOTE — Assessment & Plan Note (Signed)
Elevated BP today.  I will increase lisinopril to 10 mg daily.  BP check and BMET in 2 wks.

## 2010-09-22 NOTE — Assessment & Plan Note (Signed)
He is on Lipitor 20 as well as anaceptropib versus placebo (in a trial).  His lipids are followed via the research department.  Goal LDL < 70.

## 2010-09-27 NOTE — Progress Notes (Signed)
Addended by: Kem Parkinson on: 09/27/2010 03:06 PM   Modules accepted: Orders

## 2010-10-06 ENCOUNTER — Telehealth: Payer: Self-pay | Admitting: *Deleted

## 2010-10-06 NOTE — Telephone Encounter (Signed)
bp ok.  Continue meds.

## 2010-10-06 NOTE — Telephone Encounter (Signed)
HYPERTENSION, BENIGN - [name], MD 09/22/2010 1:12 PM Signed  Elevated BP today. I will increase lisinopril to 10 mg daily. BP check and BMET in 2 wks.   10/06/10--I talked with pt. BP readings 09/22/10 136/68  61  09/23/10 117/57 66 09/24/10 120/63 63 09/25/10 125/67 52 09/26/10 113/60 52 09/27/10 114/58 58  7/412 112/63 68 09/29/10 125/62 51 09/30/10 113/56 59 10/01/10  125/59 61 10/02/10 120/59 56 10/03/10 110/55 53 10/04/10 105/57 53 10/05/10  116/59 56   Pt had BMP done yesterday to be faxed to Dr Shirlee Latch. Generally, pt feels good. I will forward to Dr Shirlee Latch for review.

## 2010-10-10 ENCOUNTER — Telehealth: Payer: Self-pay | Admitting: *Deleted

## 2010-10-10 NOTE — Telephone Encounter (Signed)
Dr Shirlee Latch received and reviewed BMP  done 10/05/10 at [facility]. Pt is aware Dr Shirlee Latch received and reviewed results. Report sent to HIM to be scanned into EPIC.

## 2010-10-10 NOTE — Telephone Encounter (Signed)
I discussed with pt 

## 2010-10-10 NOTE — Telephone Encounter (Signed)
LMTCB

## 2010-10-11 ENCOUNTER — Encounter: Payer: Self-pay | Admitting: Cardiology

## 2010-11-23 ENCOUNTER — Other Ambulatory Visit: Payer: Self-pay | Admitting: Cardiology

## 2010-11-23 MED ORDER — ATENOLOL 100 MG PO TABS: 50.0000 mg | ORAL_TABLET | Freq: Every day | ORAL | Status: DC

## 2010-11-23 NOTE — Telephone Encounter (Signed)
Medical shop [phone].

## 2011-09-05 ENCOUNTER — Encounter: Payer: Self-pay | Admitting: *Deleted

## 2011-09-12 ENCOUNTER — Encounter: Payer: Self-pay | Admitting: *Deleted

## 2011-09-13 ENCOUNTER — Encounter: Payer: Self-pay | Admitting: Cardiology

## 2011-09-13 ENCOUNTER — Ambulatory Visit (INDEPENDENT_AMBULATORY_CARE_PROVIDER_SITE_OTHER): Payer: Medicare Other | Admitting: Cardiology

## 2011-09-13 VITALS — BP 142/70 | HR 61 | Ht 69.0 in | Wt 168.0 lb

## 2011-09-13 DIAGNOSIS — I10 Essential (primary) hypertension: Secondary | ICD-10-CM

## 2011-09-13 DIAGNOSIS — E785 Hyperlipidemia, unspecified: Secondary | ICD-10-CM

## 2011-09-13 DIAGNOSIS — I2510 Atherosclerotic heart disease of native coronary artery without angina pectoris: Secondary | ICD-10-CM

## 2011-09-13 DIAGNOSIS — I25810 Atherosclerosis of coronary artery bypass graft(s) without angina pectoris: Secondary | ICD-10-CM

## 2011-09-13 LAB — BASIC METABOLIC PANEL
GFR: 91.36 mL/min (ref >60.00)
Glucose, Bld: 115 mg/dL — ABNORMAL HIGH (ref 70–99)
Potassium: 4.1 mEq/L (ref 3.5–5.1)
Sodium: 139 mEq/L (ref 135–145)

## 2011-09-13 MED ORDER — LISINOPRIL 40 MG PO TABS: 40.0000 mg | ORAL_TABLET | Freq: Every day | ORAL | Status: DC

## 2011-09-13 MED ORDER — ATENOLOL 50 MG PO TABS: 50.0000 mg | ORAL_TABLET | Freq: Every day | ORAL | Status: DC

## 2011-09-13 NOTE — Patient Instructions (Addendum)
Your physician recommends that you return for lab work today--BMET  Your physician wants you to follow-up in: 1 year with Dr Shirlee Latch. (June 2014) You will receive a reminder letter in the mail two months in advance. If you don't receive a letter, please call our office to schedule the follow-up appointment.

## 2011-09-15 NOTE — Progress Notes (Signed)
Patient ID: Ricardo Reed, male   DOB: September 19, 1931, 78 y.o.   MRN: [id number] 78 yo with history of CAD s/p CABG and redo CABG presents for cardiology followup.  He had his redo CABG in 5/10.  Since that time, he has been doing well.  No exertional chest pain.  He walks every morning for about 2 miles.  No exertional dyspnea.  He does his yardwork with no problems.  He is now on a higher dose of lisinopril.   BP is borderline elevated this morning.  He gets his lipids through the research unit but says his total cholesterol done recently at the [facility] was 95.   ECG: NSR, LAE, LVH   PMH: 1. CAD: CABG in 1980s.  NSTEMI in 2009.  Redo CABG in 5/10 => LIMA-LAD, SVG-D, SVG-OM. 2. Asthma 3. HTN 4. Hyperlipidemia 5. GERD  SH: Widowed, lives in [place].  Has children.  Quit smoking around 1980.  Occasional ETOH.   FH: Parents and brother with CAD.    Current Outpatient Prescriptions  Medication Sig Dispense Refill  . albuterol (PROVENTIL HFA) 108 (90 BASE) MCG/ACT inhaler as needed.        [name] aspirin 81 MG tablet Take 81 mg by mouth daily.        [name] atenolol (TENORMIN) 50 MG tablet Take 1 tablet (50 mg total) by mouth daily.  90 tablet  4  . atorvastatin (LIPITOR) 20 MG tablet Take 20 mg by mouth daily.        . diphenhydrAMINE (BENADRYL) 50 MG tablet AT BEDTIME       . fluticasone (FLONASE) 50 MCG/ACT nasal spray DAILY       . lisinopril (PRINIVIL,ZESTRIL) 40 MG tablet Take 1 tablet (40 mg total) by mouth daily.  90 tablet  4  . mometasone (ASMANEX) 220 MCG/INH inhaler Inhale 2 puffs into the lungs daily.      . naproxen sodium (ANAPROX) 220 MG tablet Take 220 mg by mouth daily.        . NON FORMULARY anacetropib or placebo 100 mg once daily       . omeprazole (PRILOSEC OTC) 20 MG tablet Take 20 mg by mouth daily.        . tadalafil (CIALIS) 5 MG tablet Take 5 mg by mouth daily as needed.          BP 142/70  Pulse 61  Ht 5\' 9"  (1.753 m)  Wt 76.204 kg (168 lb)  BMI 24.81  kg/m2 General: NAD Neck: No JVD, no thyromegaly or thyroid nodule.  Lungs: Rhonchi that cleared after coughing.  CV: Nondisplaced PMI.  Heart regular S1/S2, no S3/S4, no murmur.  No peripheral edema.  No carotid bruit.  Normal pedal pulses.  Abdomen: Soft, nontender, no hepatosplenomegaly, no distention.  Neurologic: Alert and oriented x 3.  Psych: Normal affect. Extremities: No clubbing or cyanosis.

## 2011-09-15 NOTE — Assessment & Plan Note (Signed)
No ischemic symptoms.  Good exercise tolerance.  Continue ASA, beta blocker, statin (on Lipitor 20 mg daily), and ACEI.   

## 2011-09-15 NOTE — Assessment & Plan Note (Signed)
Goal LDL < 70.  He has been getting his labs through the research unit but total cholesterol done recently at [facility] was 95 so I suspect he is at goal.

## 2011-09-15 NOTE — Assessment & Plan Note (Signed)
Borderline BP elevation here in the office.  Would have him continue current meds.

## 2012-09-10 ENCOUNTER — Ambulatory Visit (INDEPENDENT_AMBULATORY_CARE_PROVIDER_SITE_OTHER): Payer: Medicare Other | Admitting: Cardiology

## 2012-09-10 ENCOUNTER — Encounter: Payer: Self-pay | Admitting: Cardiology

## 2012-09-10 VITALS — BP 128/66 | HR 51 | Ht 69.0 in | Wt 168.0 lb

## 2012-09-10 DIAGNOSIS — E785 Hyperlipidemia, unspecified: Secondary | ICD-10-CM

## 2012-09-10 DIAGNOSIS — I25810 Atherosclerosis of coronary artery bypass graft(s) without angina pectoris: Secondary | ICD-10-CM

## 2012-09-10 DIAGNOSIS — I10 Essential (primary) hypertension: Secondary | ICD-10-CM

## 2012-09-10 LAB — BASIC METABOLIC PANEL
BUN: 17 mg/dL (ref 6–23)
Calcium: 10 mg/dL (ref 8.4–10.5)
GFR: 93.64 mL/min (ref >60.00)
Glucose, Bld: 78 mg/dL (ref 70–99)
Sodium: 140 mEq/L (ref 135–145)

## 2012-09-10 LAB — LIPID PANEL
Cholesterol: 103 mg/dL (ref 0–200)
HDL: 35.1 mg/dL — ABNORMAL LOW (ref >39.00)
VLDL: 17.2 mg/dL (ref 0.0–40.0)

## 2012-09-10 LAB — CBC WITH DIFFERENTIAL/PLATELET
Basophils Absolute: 0.1 10*3/uL (ref 0.0–0.1)
Lymphocytes Relative: 11.3 % — ABNORMAL LOW (ref 12.0–46.0)
Monocytes Relative: 7.6 % (ref 3.0–12.0)
Neutrophils Relative %: 73.3 % (ref 43.0–77.0)
Platelets: 182 10*3/uL (ref 150.0–400.0)
RDW: 13.3 % (ref 11.5–14.6)

## 2012-09-10 NOTE — Progress Notes (Signed)
Patient ID: Ricardo Reed, male   DOB: September 08, 1931, 79 y.o.   MRN: [id number] 79 yo with history of CAD s/p CABG and redo CABG presents for cardiology followup.  He had his redo CABG in 5/10.  Since that time, he has been doing well.  No exertional chest pain.  He walks every morning for about 2 miles.  No exertional dyspnea.  He does his yardwork with no problems.  BP is good today.  HR 51 today, tends to run in the 60s at home.  No lightheadedness or syncope.   ECG: NSR,old inferior MI, inferolateral T wave inversions  PMH: 1. CAD: CABG in 1980s.  NSTEMI in 2009.  Redo CABG in 5/10 => LIMA-LAD, SVG-D, SVG-OM. 2. Asthma 3. HTN 4. Hyperlipidemia 5. GERD  SH: Widowed, lives in [place].  Has children.  Quit smoking around 1980.  Occasional ETOH.   FH: Parents and brother with CAD.    Current Outpatient Prescriptions  Medication Sig Dispense Refill  . albuterol (PROVENTIL HFA) 108 (90 BASE) MCG/ACT inhaler as needed.        [name] aspirin 81 MG tablet Take 81 mg by mouth daily.        [name] atenolol (TENORMIN) 50 MG tablet Take 1 tablet (50 mg total) by mouth daily.  90 tablet  4  . atorvastatin (LIPITOR) 20 MG tablet Take 20 mg by mouth daily.        . diphenhydrAMINE (BENADRYL) 50 MG tablet AT BEDTIME       . fluticasone (FLONASE) 50 MCG/ACT nasal spray DAILY       . lisinopril (PRINIVIL,ZESTRIL) 40 MG tablet Take 1 tablet (40 mg total) by mouth daily.  90 tablet  4  . mometasone (ASMANEX) 220 MCG/INH inhaler Inhale 2 puffs into the lungs daily.      . naproxen sodium (ANAPROX) 220 MG tablet Take 220 mg by mouth daily.        . NON FORMULARY anacetropib or placebo 100 mg once daily       . omeprazole (PRILOSEC OTC) 20 MG tablet Take 20 mg by mouth daily.        . tadalafil (CIALIS) 5 MG tablet Take 5 mg by mouth daily as needed.         No current facility-administered medications for this visit.    BP 128/66  Pulse 51  Ht 5\' 9"  (1.753 m)  Wt 168 lb (76.204 kg)  BMI 24.8 kg/m2 General:  NAD Neck: No JVD, no thyromegaly or thyroid nodule.  Lungs: Rhonchi that cleared after coughing.  CV: Nondisplaced PMI.  Heart regular S1/S2, no S3/S4, no murmur.  No peripheral edema.  No carotid bruit.  Normal pedal pulses.  Abdomen: Soft, nontender, no hepatosplenomegaly, no distention.  Neurologic: Alert and oriented x 3.  Psych: Normal affect. Extremities: No clubbing or cyanosis.   Assessment/Plan: 1. CAD: S/p CABG.  No ischemic symptoms.  Continue ASA 81, atenolol, lisinopril, and atorvastatin.  2. HTN: BP under good control on atenolol and lisinopril.  BMET today.  3. Hyperlipidemia: Will check lipids today.   Followup in 1 year unless issues come up.   [name] 09/10/2012

## 2012-09-10 NOTE — Patient Instructions (Addendum)
Your physician wants you to follow-up in: ONE YEAR WITH DR [facility] You will receive a reminder letter in the mail two months in advance. If you don't receive a letter, please call our office to schedule the follow-up appointment.   Your physician recommends that you HAVE LAB WORK TODAY

## 2013-09-04 ENCOUNTER — Ambulatory Visit (INDEPENDENT_AMBULATORY_CARE_PROVIDER_SITE_OTHER): Payer: Medicare Other | Admitting: Cardiology

## 2013-09-04 ENCOUNTER — Encounter: Payer: Self-pay | Admitting: Cardiology

## 2013-09-04 VITALS — BP 132/70 | HR 49 | Ht 69.0 in | Wt 169.0 lb

## 2013-09-04 DIAGNOSIS — I25810 Atherosclerosis of coronary artery bypass graft(s) without angina pectoris: Secondary | ICD-10-CM

## 2013-09-04 DIAGNOSIS — E785 Hyperlipidemia, unspecified: Secondary | ICD-10-CM

## 2013-09-04 DIAGNOSIS — I10 Essential (primary) hypertension: Secondary | ICD-10-CM

## 2013-09-04 LAB — CBC WITH DIFFERENTIAL/PLATELET
BASOS ABS: 0.1 10*3/uL (ref 0.0–0.1)
BASOS PCT: 0.8 % (ref 0.0–3.0)
EOS ABS: 0.7 10*3/uL (ref 0.0–0.7)
Eosinophils Relative: 7.7 % — ABNORMAL HIGH (ref 0.0–5.0)
HCT: 46.7 % (ref 39.0–52.0)
Hemoglobin: 15.4 g/dL (ref 13.0–17.0)
LYMPHS PCT: 15.9 % (ref 12.0–46.0)
Lymphs Abs: 1.4 10*3/uL (ref 0.7–4.0)
MCHC: 33 g/dL (ref 30.0–36.0)
MCV: 89.7 fl (ref 78.0–100.0)
MONO ABS: 0.8 10*3/uL (ref 0.1–1.0)
Monocytes Relative: 9.2 % (ref 3.0–12.0)
NEUTROS PCT: 66.4 % (ref 43.0–77.0)
Neutro Abs: 5.7 10*3/uL (ref 1.4–7.7)
PLATELETS: 178 10*3/uL (ref 150.0–400.0)
RBC: 5.21 Mil/uL (ref 4.22–5.81)
RDW: 13.3 % (ref 11.5–15.5)
WBC: 8.6 10*3/uL (ref 4.0–10.5)

## 2013-09-04 LAB — LIPID PANEL
CHOL/HDL RATIO: 3
Cholesterol: 97 mg/dL (ref 0–200)
HDL: 36.5 mg/dL — ABNORMAL LOW (ref >39.00)
LDL Cholesterol: 43 mg/dL (ref 0–99)
NonHDL: 60.5
TRIGLYCERIDES: 89 mg/dL (ref 0.0–149.0)
VLDL: 17.8 mg/dL (ref 0.0–40.0)

## 2013-09-04 LAB — BASIC METABOLIC PANEL
BUN: 20 mg/dL (ref 6–23)
CHLORIDE: 103 meq/L (ref 96–112)
CO2: 28 meq/L (ref 19–32)
Calcium: 9.7 mg/dL (ref 8.4–10.5)
Creatinine, Ser: 0.9 mg/dL (ref 0.4–1.5)
GFR: 90.9 mL/min (ref >60.00)
Glucose, Bld: 101 mg/dL — ABNORMAL HIGH (ref 70–99)
POTASSIUM: 4.2 meq/L (ref 3.5–5.1)
Sodium: 140 mEq/L (ref 135–145)

## 2013-09-04 NOTE — Patient Instructions (Addendum)
Your physician wants you to follow-up in: 1 year with Dr Shirlee Latch. (June 2016).  You will receive a reminder letter in the mail two months in advance. If you don't receive a letter, please call our office to schedule the follow-up appointment.   Your physician recommends that you have  lab work today--lipid profile/BMET/CBCD

## 2013-09-05 NOTE — Progress Notes (Signed)
Patient ID: Ricardo NoelBilly R Kaleta, male   DOB: 05/19/1931, 80 y.o.   MRN: [id number] 80 yo with history of CAD s/p CABG and redo CABG presents for cardiology followup.  He had his redo CABG in 5/10.  Since that time, he has been doing well.  No exertional chest pain.  He walks every morning for about 2 miles.  No exertional dyspnea.  He does his yardwork with no problems (weedeating, mowing).  BP is good today.  He has chronic mild bradycardia.  No lightheadedness or syncope. He golfs 2-3 times a week.   ECG: NSR, inferolateral T wave inversions (no change from prior)  Labs (6/14): LDL 51, HDL 35, K 4.1, creatinine 0.8, HCT 48  PMH: 1. CAD: CABG in 1980s.  NSTEMI in 2009.  Redo CABG in 5/10 => LIMA-LAD, SVG-D, SVG-OM. 2. Asthma 3. HTN 4. Hyperlipidemia 5. GERD  SH: Widowed, [place] in [place].  Has children.  Quit smoking around 1980.  Occasional ETOH.   FH: Parents and brother with CAD.    Current Outpatient Prescriptions  Medication Sig Dispense Refill  . albuterol (PROVENTIL HFA) 108 (90 BASE) MCG/ACT inhaler as needed.        [name]. aspirin 81 MG tablet Take 81 mg by mouth daily.        [name]. atenolol (TENORMIN) 50 MG tablet Take 1 tablet (50 mg total) by mouth daily.  90 tablet  4  . atorvastatin (LIPITOR) 20 MG tablet Take 20 mg by mouth daily.        . diphenhydrAMINE (BENADRYL) 50 MG tablet AT BEDTIME       . fluticasone (FLONASE) 50 MCG/ACT nasal spray DAILY       . lisinopril (PRINIVIL,ZESTRIL) 40 MG tablet Take 1 tablet (40 mg total) by mouth daily.  90 tablet  4  . mometasone (ASMANEX) 220 MCG/INH inhaler Inhale 2 puffs into the lungs daily.      . naproxen sodium (ANAPROX) 220 MG tablet Take 220 mg by mouth daily.        . NON FORMULARY anacetropib or placebo 100 mg once daily       . omeprazole (PRILOSEC OTC) 20 MG tablet Take 20 mg by mouth daily.        . tadalafil (CIALIS) 5 MG tablet Take 5 mg by mouth daily as needed.         No current facility-administered medications for this  visit.    BP 132/70  Pulse 49  Ht 5\' 9"  (1.753 m)  Wt 76.658 kg (169 lb)  BMI 24.95 kg/m2 General: NAD Neck: No JVD, no thyromegaly or thyroid nodule.  Lungs: Rhonchi that cleared after coughing.  CV: Nondisplaced PMI.  Heart regular S1/S2, no S3/S4, no murmur.  No peripheral edema.  No carotid bruit.  Normal pedal pulses.  Abdomen: Soft, nontender, no hepatosplenomegaly, no distention.  Neurologic: Alert and oriented x 3.  Psych: Normal affect. Extremities: No clubbing or cyanosis.   Assessment/Plan: 1. CAD: S/p CABG.  No ischemic symptoms.  Continue ASA 81, atenolol, lisinopril, and atorvastatin.  2. HTN: BP under good control on atenolol and lisinopril.  BMET today.  3. Hyperlipidemia: Will check lipids today.   Followup in 1 year unless issues come up.   Marca AnconaDalton Sarah-Jane Nazario 09/05/2013

## 2014-08-25 ENCOUNTER — Ambulatory Visit (INDEPENDENT_AMBULATORY_CARE_PROVIDER_SITE_OTHER): Payer: Medicare Other | Admitting: Cardiology

## 2014-08-25 ENCOUNTER — Encounter: Payer: Self-pay | Admitting: Cardiology

## 2014-08-25 VITALS — BP 128/62 | HR 53 | Ht 69.0 in | Wt 166.0 lb

## 2014-08-25 DIAGNOSIS — I25810 Atherosclerosis of coronary artery bypass graft(s) without angina pectoris: Secondary | ICD-10-CM | POA: Diagnosis not present

## 2014-08-25 MED ORDER — ATENOLOL 25 MG PO TABS: 25.0000 mg | ORAL_TABLET | Freq: Every day | ORAL | Status: DC

## 2014-08-25 NOTE — Patient Instructions (Signed)
Medication Instructions:  Take atenolol 25mg  daily.   Labwork: Please fax a copy of your lab to Dr Shirlee LatchMcLean [phone]  Testing/Procedures: None today.  Follow-Up: Your physician wants you to follow-up in: 1 year with Dr Shirlee LatchMcLean. (May 2017).  You will receive a reminder letter in the mail two months in advance. If you don't receive a letter, please call our office to schedule the follow-up appointment.

## 2014-08-26 NOTE — Progress Notes (Signed)
Patient ID: Ricardo Reed, male   DOB: 09-30-1931, 81 y.o.   MRN: [id number] 81 yo with history of CAD s/p CABG and redo CABG presents for cardiology followup.  He had his redo CABG in 5/10.  No exertional chest pain.  He walks every morning for about 2 miles.  No exertional dyspnea.  He does his yardwork with no problems (weedeating, mowing).  BP is good today.  He has chronic mild bradycardia, atenolol was recently decreased to 25 mg daily with HR in the 40s at times.  HR today is 53.  No lightheadedness or syncope. He golfs 2-3 times a week typically.   About a week ago, Mr Ricardo Reed woke up with vertigo (sensation of the room spinning).  He became nauseated.  For the next few days, he had episodes of vertigo anytime he would turn his head too fast to the side.  He was admitted overnight to the hospital in [place].  He says he had an MRI that showed no stroke and carotid dopplers that showed no significant stenosis.  Echo was also done and per his report was unremarkable.  The vertigo has gradually subsided on its own and he is doing pretty well now.    ECG: NSR, inferolateral T wave inversions (no change from prior)  Labs (6/14): LDL 51, HDL 35, K 4.1, creatinine 0.8, HCT 48 Labs (6/15): LDL 43, HDL 36.5, K 4.2, creatinine 0.9, HCT 46.7  PMH: 1. CAD: CABG in 1980s.  NSTEMI in 2009.  Redo CABG in 5/10 => LIMA-LAD, SVG-D, SVG-OM. 2. Asthma 3. HTN 4. Hyperlipidemia 5. GERD 6. Vertigo 5/16: Suspect BPPV.  Was hospitalized in [place].  MRI head negative for CVA, carotid dopplers without significant stenosis (per his report).  7. Sinus bradycardia  SH: Widowed, lives in [place].  Has children.  Quit smoking around 1980.  Occasional ETOH.   FH: Parents and brother with CAD.   ROS: All systems reviewed and negative except per HPI.    Current Outpatient Prescriptions  Medication Sig Dispense Refill  . albuterol (PROVENTIL HFA) 108 (90 BASE) MCG/ACT inhaler as needed.      [name] aspirin 81 MG  tablet Take 81 mg by mouth daily.      [name] atorvastatin (LIPITOR) 20 MG tablet Take 20 mg by mouth daily.      . diphenhydrAMINE (BENADRYL) 50 MG tablet AT BEDTIME     . fluticasone (FLONASE) 50 MCG/ACT nasal spray DAILY     . lisinopril (PRINIVIL,ZESTRIL) 40 MG tablet Take 1 tablet (40 mg total) by mouth daily. 90 tablet 4  . mometasone (ASMANEX) 220 MCG/INH inhaler Inhale 2 puffs into the lungs daily.    . naproxen sodium (ANAPROX) 220 MG tablet Take 220 mg by mouth daily.      . NON FORMULARY anacetropib or placebo 100 mg once daily     . omeprazole (PRILOSEC OTC) 20 MG tablet Take 20 mg by mouth daily.      [name] atenolol (TENORMIN) 25 MG tablet Take 1 tablet (25 mg total) by mouth daily. 90 tablet 3   No current facility-administered medications for this visit.    BP 128/62 mmHg  Pulse 53  Ht  (1.753 m)  Wt 166 lb (75.297 kg)  BMI 24.50 kg/m2 General: NAD Neck: No JVD, no thyromegaly or thyroid nodule.  Lungs: Rhonchi that cleared after coughing.  CV: Nondisplaced PMI.  Heart regular S1/S2, no S3/S4, no murmur.  No peripheral edema.  No carotid bruit.  Normal pedal pulses.  Abdomen: Soft, nontender, no hepatosplenomegaly, no distention.  Neurologic: Alert and oriented x 3.  Psych: Normal affect. Extremities: No clubbing or cyanosis.   Assessment/Plan: 1. CAD: S/p CABG.  No ischemic symptoms.  Continue ASA 81, atenolol at lower dose, lisinopril, and atorvastatin.  2. HTN: BP under good control on atenolol and lisinopril.  With lower dose of atenolol, he will check BP daily and let me know if his BP is running consistently high (SBP > 140).   3. Hyperlipidemia: To get lipids later this week at the [facility] in [place].  He will send me a copy.  Goal LDL < 70.  4. Bradycardia: HR in 40s, cut back on atenolol to 25 mg daily.  HR now in 50s.  He will also keep track of HR and let me know if it consistently runs < 50.   5. Vertigo: Suspect BPPV (would occur with sudden head movements).   Resolving now.  Inpatient workup negative for CVA or significant carotid stenosis.  If symptoms recur, would consider vestibular rehab.   Marca AnconaDalton Avier Jech 08/26/2014

## 2014-08-28 ENCOUNTER — Encounter: Payer: Self-pay | Admitting: Cardiology

## 2014-12-25 ENCOUNTER — Ambulatory Visit: Payer: BLUE CROSS/BLUE SHIELD | Admitting: Cardiology

## 2015-02-01 ENCOUNTER — Other Ambulatory Visit: Payer: Self-pay

## 2015-02-01 ENCOUNTER — Telehealth: Payer: Self-pay | Admitting: *Deleted

## 2015-02-01 NOTE — Telephone Encounter (Signed)
-----   Message from Laurey Moralealton S McLean, MD sent at 01/30/2015 11:42 PM EDT ----- Regarding: FW: End of REVEAL Research Study Mr Ricardo Reed will need to be on atorvastatin 20 mg daily.   ----- Message -----    From: Starla LinkSally A Milks, RN    Sent: 01/28/2015   9:06 AM      To: Laurey Moralealton S McLean, MD Subject: End of REVEAL Research Study                   Ricardo Reed has been taking part in the REVEAL Research Study for the past 4 years. As part of the study, he has been taking atorvastatin 20 mg daily and anacetrapib 100 mg or placebo daily.  Both medications were provided to the patient as part of the study.  The study has ended and he will need a prescription for a statin.  It is recommended that he continues on a similar lipid-lowering potency to that which he has been taking during the study. Also, it is advised against measuring lipids at this stage as it may be difficult to interpret the results until several weeks after stopping the study treatment. He  will stop all study medication on 02/01/2015.  So that statin therapy is not interrupted, please call in a prescription for statin therapy for this patient.   Thank you, Ricardo ShownSally Milks, RN, Research Nurse

## 2015-02-01 NOTE — Telephone Encounter (Signed)
LMTCB

## 2015-02-01 NOTE — Telephone Encounter (Signed)
Pt advised, states he gets his medication at [facility]. Pt states he has a prescription and will get atorvastatin 20mg  daily [facility].

## 2015-05-18 ENCOUNTER — Ambulatory Visit (HOSPITAL_COMMUNITY)
Admission: RE | Admit: 2015-05-18 | Discharge: 2015-05-18 | Disposition: A | Discharge status: Home / Self Care | Payer: Medicare Other | Source: Ambulatory Visit | Attending: Cardiology | Admitting: Cardiology

## 2015-05-18 ENCOUNTER — Other Ambulatory Visit (HOSPITAL_COMMUNITY): Payer: Self-pay | Admitting: Adult Health

## 2015-05-18 ENCOUNTER — Inpatient Hospital Stay (HOSPITAL_COMMUNITY)
Admission: AD | Admit: 2015-05-18 | Discharge: 2015-05-20 | DRG: 282 | Disposition: A | Discharge status: Home / Self Care | Payer: Medicare Other | Source: Ambulatory Visit | Attending: Cardiology | Admitting: Cardiology

## 2015-05-18 VITALS — BP 134/60 | HR 65 | Wt 165.8 lb

## 2015-05-18 DIAGNOSIS — K219 Gastro-esophageal reflux disease without esophagitis: Secondary | ICD-10-CM | POA: Diagnosis present

## 2015-05-18 DIAGNOSIS — Z7951 Long term (current) use of inhaled steroids: Secondary | ICD-10-CM

## 2015-05-18 DIAGNOSIS — I10 Essential (primary) hypertension: Secondary | ICD-10-CM | POA: Diagnosis not present

## 2015-05-18 DIAGNOSIS — I2510 Atherosclerotic heart disease of native coronary artery without angina pectoris: Secondary | ICD-10-CM | POA: Diagnosis present

## 2015-05-18 DIAGNOSIS — J45909 Unspecified asthma, uncomplicated: Secondary | ICD-10-CM | POA: Diagnosis present

## 2015-05-18 DIAGNOSIS — Z951 Presence of aortocoronary bypass graft: Secondary | ICD-10-CM | POA: Diagnosis not present

## 2015-05-18 DIAGNOSIS — E785 Hyperlipidemia, unspecified: Secondary | ICD-10-CM | POA: Diagnosis present

## 2015-05-18 DIAGNOSIS — Z87891 Personal history of nicotine dependence: Secondary | ICD-10-CM

## 2015-05-18 DIAGNOSIS — R001 Bradycardia, unspecified: Secondary | ICD-10-CM | POA: Diagnosis present

## 2015-05-18 DIAGNOSIS — Z8249 Family history of ischemic heart disease and other diseases of the circulatory system: Secondary | ICD-10-CM

## 2015-05-18 DIAGNOSIS — I252 Old myocardial infarction: Secondary | ICD-10-CM

## 2015-05-18 DIAGNOSIS — I214 Non-ST elevation (NSTEMI) myocardial infarction: Secondary | ICD-10-CM | POA: Diagnosis not present

## 2015-05-18 DIAGNOSIS — R079 Chest pain, unspecified: Secondary | ICD-10-CM | POA: Diagnosis present

## 2015-05-18 DIAGNOSIS — I200 Unstable angina: Secondary | ICD-10-CM

## 2015-05-18 DIAGNOSIS — Z7982 Long term (current) use of aspirin: Secondary | ICD-10-CM

## 2015-05-18 DIAGNOSIS — Z791 Long term (current) use of non-steroidal anti-inflammatories (NSAID): Secondary | ICD-10-CM

## 2015-05-18 LAB — COMPREHENSIVE METABOLIC PANEL
ALT: 37 U/L (ref 17–63)
ANION GAP: 12 (ref 5–15)
AST: 37 U/L (ref 15–41)
Albumin: 4.2 g/dL (ref 3.5–5.0)
Alkaline Phosphatase: 90 U/L (ref 38–126)
BUN: 19 mg/dL (ref 6–20)
CHLORIDE: 105 mmol/L (ref 101–111)
CO2: 24 mmol/L (ref 22–32)
Calcium: 9.9 mg/dL (ref 8.9–10.3)
Creatinine, Ser: 0.9 mg/dL (ref 0.61–1.24)
Glucose, Bld: 102 mg/dL — ABNORMAL HIGH (ref 65–99)
POTASSIUM: 5.1 mmol/L (ref 3.5–5.1)
Sodium: 141 mmol/L (ref 135–145)
TOTAL PROTEIN: 7.2 g/dL (ref 6.5–8.1)
Total Bilirubin: 1.1 mg/dL (ref 0.3–1.2)

## 2015-05-18 LAB — CBC
HCT: 46.3 % (ref 39.0–52.0)
HCT: 47 % (ref 39.0–52.0)
HEMOGLOBIN: 15.5 g/dL (ref 13.0–17.0)
Hemoglobin: 15.1 g/dL (ref 13.0–17.0)
MCH: 29.9 pg (ref 26.0–34.0)
MCH: 29.8 pg (ref 26.0–34.0)
MCHC: 32.6 g/dL (ref 30.0–36.0)
MCHC: 33 g/dL (ref 30.0–36.0)
MCV: 91.7 fL (ref 78.0–100.0)
MCV: 90.2 fL (ref 78.0–100.0)
PLATELETS: 163 10*3/uL (ref 150–400)
Platelets: 150 10*3/uL (ref 150–400)
RBC: 5.05 MIL/uL (ref 4.22–5.81)
RBC: 5.21 MIL/uL (ref 4.22–5.81)
RDW: 12.8 % (ref 11.5–15.5)
RDW: 12.7 % (ref 11.5–15.5)
WBC: 9.6 10*3/uL (ref 4.0–10.5)
WBC: 9.8 10*3/uL (ref 4.0–10.5)

## 2015-05-18 LAB — CREATININE, SERUM
CREATININE: 0.81 mg/dL (ref 0.61–1.24)
GFR calc Af Amer: >60 mL/min (ref >60)
GFR calc non Af Amer: >60 mL/min (ref >60)

## 2015-05-18 LAB — TROPONIN I
TROPONIN I: 0.33 ng/mL — AB (ref <0.031)
Troponin I: 0.33 ng/mL — ABNORMAL HIGH (ref <0.031)
Troponin I: 0.36 ng/mL — ABNORMAL HIGH (ref <0.031)
Troponin I: 0.43 ng/mL — ABNORMAL HIGH (ref <0.031)

## 2015-05-18 MED ORDER — DIPHENHYDRAMINE HCL 25 MG PO TABS
25.0000 mg | ORAL_TABLET | Freq: Every day | ORAL | Status: DC
  Filled 2015-05-18: qty 1

## 2015-05-18 MED ORDER — ATORVASTATIN CALCIUM 20 MG PO TABS
20.0000 mg | ORAL_TABLET | Freq: Every day | ORAL | Status: DC
  Administered 2015-05-18: 20 mg via ORAL
  Filled 2015-05-18: qty 1

## 2015-05-18 MED ORDER — HEPARIN BOLUS VIA INFUSION
4000.0000 [IU] | Freq: Once | INTRAVENOUS | Status: AC
  Administered 2015-05-18: 4000 [IU] via INTRAVENOUS
  Filled 2015-05-18: qty 4000

## 2015-05-18 MED ORDER — ENOXAPARIN SODIUM 80 MG/0.8ML ~~LOC~~ SOLN
1.0000 mg/kg | Freq: Two times a day (BID) | SUBCUTANEOUS | Status: DC
  Filled 2015-05-18: qty 0.8

## 2015-05-18 MED ORDER — BUDESONIDE 0.25 MG/2ML IN SUSP
0.2500 mg | Freq: Two times a day (BID) | RESPIRATORY_TRACT | Status: DC
  Administered 2015-05-18 – 2015-05-20 (×4): 0.25 mg via RESPIRATORY_TRACT
  Filled 2015-05-18 (×4): qty 2

## 2015-05-18 MED ORDER — LISINOPRIL 40 MG PO TABS: 40.0000 mg | ORAL_TABLET | Freq: Every day | ORAL | Status: DC

## 2015-05-18 MED ORDER — HEPARIN (PORCINE) IN NACL 100-0.45 UNIT/ML-% IJ SOLN
900.0000 [IU]/h | INTRAMUSCULAR | Status: DC
  Administered 2015-05-18: 900 [IU]/h via INTRAVENOUS
  Filled 2015-05-18: qty 250

## 2015-05-18 MED ORDER — ONDANSETRON HCL 4 MG/2ML IJ SOLN: 4.0000 mg | Freq: Four times a day (QID) | INTRAMUSCULAR | Status: DC | PRN

## 2015-05-18 MED ORDER — ACETAMINOPHEN 325 MG PO TABS: 650.0000 mg | ORAL_TABLET | ORAL | Status: DC | PRN

## 2015-05-18 MED ORDER — ASPIRIN EC 81 MG PO TBEC: 81.0000 mg | DELAYED_RELEASE_TABLET | Freq: Every day | ORAL | Status: DC

## 2015-05-18 MED ORDER — ZOLPIDEM TARTRATE 5 MG PO TABS
5.0000 mg | ORAL_TABLET | Freq: Every day | ORAL | Status: DC
  Administered 2015-05-18 – 2015-05-19 (×2): 5 mg via ORAL
  Filled 2015-05-18 (×2): qty 1

## 2015-05-18 MED ORDER — OMEPRAZOLE MAGNESIUM 20 MG PO TBEC: 20.0000 mg | DELAYED_RELEASE_TABLET | Freq: Every day | ORAL | Status: DC

## 2015-05-18 MED ORDER — FLUTICASONE PROPIONATE HFA 44 MCG/ACT IN AERO: 2.0000 | INHALATION_SPRAY | Freq: Two times a day (BID) | RESPIRATORY_TRACT | Status: DC

## 2015-05-18 MED ORDER — PANTOPRAZOLE SODIUM 40 MG PO TBEC
40.0000 mg | DELAYED_RELEASE_TABLET | Freq: Every day | ORAL | Status: DC
  Administered 2015-05-18 – 2015-05-19 (×2): 40 mg via ORAL
  Filled 2015-05-18 (×2): qty 1

## 2015-05-18 MED ORDER — GI COCKTAIL ~~LOC~~: 30.0000 mL | Freq: Four times a day (QID) | ORAL | Status: DC | PRN

## 2015-05-18 MED ORDER — ATENOLOL 25 MG PO TABS
50.0000 mg | ORAL_TABLET | Freq: Every day | ORAL | Status: DC
  Administered 2015-05-19: 50 mg via ORAL
  Filled 2015-05-18 (×4): qty 2

## 2015-05-18 MED ORDER — LISINOPRIL 40 MG PO TABS
40.0000 mg | ORAL_TABLET | Freq: Every day | ORAL | Status: DC
  Administered 2015-05-18 – 2015-05-19 (×2): 40 mg via ORAL
  Filled 2015-05-18 (×2): qty 1

## 2015-05-18 NOTE — Progress Notes (Signed)
Pt arrived to unit.  Pt placed on telemetry and CCMD notified.  Pt denies chest pain at this time.  Pt oriented to unit including call light and telephone.  Will cont to monitor.

## 2015-05-18 NOTE — Progress Notes (Signed)
ANTICOAGULATION CONSULT NOTE - Initial Consult  Pharmacy Consult for heparin Indication: chest pain/ACS  No Known Allergies  Patient Measurements:   Heparin Dosing Weight:   Vital Signs: BP: 134/60 mmHg (02/21 1342) Pulse Rate: 65 (02/21 1342)  Labs:  Recent Labs  05/18/15 1433 05/18/15 1730  HGB 15.1 15.5  HCT 46.3 47.0  PLT 163 150  CREATININE 0.90 0.81  TROPONINI 0.33* 0.33*    CrCl cannot be calculated (Unknown ideal weight.).   Medical History: Past Medical History  Diagnosis Date  . Unspecified asthma(493.90)   . Old myocardial infarction   . Coronary atherosclerosis of autologous vein bypass graft   . Asthma   . Essential hypertension, benign   . Other and unspecified hyperlipidemia   . GERD (gastroesophageal reflux disease)   . NSTEMI (non-ST elevated myocardial infarction) 2009    Medications:  Scheduled:  . aspirin EC  81 mg Oral Daily  . atenolol  50 mg Oral Daily  . atorvastatin  20 mg Oral q1800  . budesonide (PULMICORT) nebulizer solution  0.25 mg Nebulization BID  . lisinopril  40 mg Oral q1800  . pantoprazole  40 mg Oral q1800  . zolpidem  5 mg Oral QHS    Assessment: 82yo male with CAD- CABG & redo CABG now presenting from Cardiology office visit with unstable angina vs NSTEMI; plans for cath tomorrow.  He is on no anticoagulation at home.  Cr 0.9 Hg 15.5 and pltc 150  Goal of Therapy:  Heparin level 0.3-0.7 units/ml Monitor platelets by anticoagulation protocol: Yes   Plan:  Heparin 4000 units IV x 1, then 900 units/hr Check Heparin level and CBC in 6hr Daily HL, CBC Watch for s/s of bleeding  Marisue Humble, PharmD Clinical Pharmacist [facility]

## 2015-05-18 NOTE — Progress Notes (Signed)
Patient ID: Ricardo Reed, male   DOB: 1931/04/03, 82 y.o.   MRN: [id number]   82 yo with history of CAD s/p CABG and redo CABG presents for cardiology followup.  He had his redo CABG in 5/10.  Since around the first of the year, he has been having chest tightness episodes.  These are in the lower chest/epigastric area.  This is new for him, had not had chest pain since his redo CABG in 2010.  He has noted the chest tightness playing golf at times, usually mild.  He noted it more strongly picking up pine cones in his yard.  He also has episodes that occur at rest.  Last night, while getting ready for bed, he had very severe epigstric/lower chest tightness that lasted about 20 minutes.  It was reminiscent of his prior MI.  It resolved and he went to bed.  He woke up this morning with mild, 2/10 pain.  It lingered for several hours but resolved by noon.  Currently, no symptoms.    ECG: NSR, T wave flattening laterally and inferiorly (some difference from prior).   Labs (6/14): LDL 51, HDL 35, K 4.1, creatinine 0.8, HCT 48 Labs (6/15): LDL 43, HDL 36.5, K 4.2, creatinine 0.9, HCT 46.7 Labs (6/16): LDL 57, K 5, creatinine 1.02, HCT 47  PMH: 1. CAD: CABG in 1980s.  NSTEMI in 2009.  Redo CABG in 5/10 => LIMA-LAD, SVG-D, SVG-OM. 2. Asthma 3. HTN 4. Hyperlipidemia 5. GERD 6. Vertigo 5/16: Suspect BPPV.  Was hospitalized in [place].  MRI head negative for CVA, carotid dopplers without significant stenosis (per his report).  7. Sinus bradycardia  SH: Widowed, lives in [place].  Has children.  Quit smoking around 1980.  Occasional ETOH.   FH: Parents and brother with CAD.   ROS: All systems reviewed and negative except per HPI.    Current Outpatient Prescriptions  Medication Sig Dispense Refill  . albuterol (PROVENTIL HFA) 108 (90 BASE) MCG/ACT inhaler as needed.      [name] aspirin 81 MG tablet Take 81 mg by mouth daily.      [name] atenolol (TENORMIN) 50 MG tablet Take 50 mg by mouth daily.    [name]  atorvastatin (LIPITOR) 20 MG tablet Take 20 mg by mouth daily.      . diphenhydrAMINE (BENADRYL) 25 MG tablet Take 25 mg by mouth at bedtime.    . fluticasone (FLONASE) 50 MCG/ACT nasal spray DAILY     . lisinopril (PRINIVIL,ZESTRIL) 40 MG tablet Take 1 tablet (40 mg total) by mouth daily. 90 tablet 4  . mometasone (ASMANEX) 220 MCG/INH inhaler Inhale 2 puffs into the lungs daily.    . naproxen sodium (ANAPROX) 220 MG tablet Take 220 mg by mouth daily.      [name] omeprazole (PRILOSEC OTC) 20 MG tablet Take 20 mg by mouth daily.       No current facility-administered medications for this encounter.    BP 134/60 mmHg  Pulse 65  Wt 165 lb 12 oz (75.184 kg)  SpO2 97% General: NAD Neck: No JVD, no thyromegaly or thyroid nodule.  Lungs: Rhonchi that cleared after coughing.  CV: Nondisplaced PMI.  Heart regular S1/S2, no S3/S4, no murmur.  No peripheral edema.  No carotid bruit.  Normal pedal pulses.  Abdomen: Soft, nontender, no hepatosplenomegaly, no distention.  Neurologic: Alert and oriented x 3.  Psych: Normal affect. Extremities: No clubbing or cyanosis.   Assessment/Plan: 1. CAD: S/p CABG then redo CABG.  Current  symptoms are very concerning for unstable angina versus NSTEMI (given prolonged pain last night and this morning).  I told him that he needs to be admitted.  - I will admit to telemetry today.  - Draw troponin level and cycle.  - Continue ASA 81, atenolol, atorvastatin, lisinopril.   - If troponin is elevated, will use heparin gtt.  - Will need cardiac cath, will arrange for tomorrow.  I discussed risks/benefits of the procedure with him and he agrees to proceed with cath.  2. Hyperlipidemia: Continue atorvastatin.    [name] 05/18/2015

## 2015-05-18 NOTE — Addendum Note (Signed)
Encounter addended by: Chyrl Civatte, RN on: 05/18/2015  2:27 PM<BR>     Documentation filed: [employer], Orders

## 2015-05-18 NOTE — H&P (Addendum)
H&P Heart Failure  82 yo with history of CAD s/p CABG and redo CABG presents for cardiology followup. He had his redo CABG in 5/10. Since around the first of the year, he has been having chest tightness episodes. These are in the lower chest/epigastric area. This is new for him, had not had chest pain since his redo CABG in 2010. He has noted the chest tightness playing golf at times, usually mild. He noted it more strongly picking up pine cones in his yard. He also has episodes that occur at rest. Last night, while getting ready for bed, he had very severe epigstric/lower chest tightness that lasted about 20 minutes. It was reminiscent of his prior MI. It resolved and he went to bed. He woke up this morning with mild, 2/10 pain. It lingered for several hours but resolved by noon. Currently, no symptoms.   ECG: NSR, T wave flattening laterally and inferiorly (some difference from prior).   Labs (6/14): LDL 51, HDL 35, K 4.1, creatinine 0.8, HCT 48 Labs (6/15): LDL 43, HDL 36.5, K 4.2, creatinine 0.9, HCT 46.7 Labs (6/16): LDL 57, K 5, creatinine 1.02, HCT 47  PMH: 1. CAD: CABG in 1980s. NSTEMI in 2009. Redo CABG in 5/10 => LIMA-LAD, SVG-D, SVG-OM. 2. Asthma 3. HTN 4. Hyperlipidemia 5. GERD 6. Vertigo 5/16: Suspect BPPV. Was hospitalized in [place]. MRI head negative for CVA, carotid dopplers without significant stenosis (per his report).  7. Sinus bradycardia  SH: Widowed, lives in [place]. Has children. Quit smoking around 1980. Occasional ETOH.   FH: Parents and brother with CAD.   ROS: All systems reviewed and negative except per HPI.    Current Outpatient Prescriptions  Medication Sig Dispense Refill  . albuterol (PROVENTIL HFA) 108 (90 BASE) MCG/ACT inhaler as needed.     [name] aspirin 81 MG tablet Take 81 mg by mouth daily.     [name] atenolol (TENORMIN) 50 MG tablet Take 50 mg by mouth daily.    [name] atorvastatin (LIPITOR) 20 MG tablet  Take 20 mg by mouth daily.     . diphenhydrAMINE (BENADRYL) 25 MG tablet Take 25 mg by mouth at bedtime.    . fluticasone (FLONASE) 50 MCG/ACT nasal spray DAILY     . lisinopril (PRINIVIL,ZESTRIL) 40 MG tablet Take 1 tablet (40 mg total) by mouth daily. 90 tablet 4  . mometasone (ASMANEX) 220 MCG/INH inhaler Inhale 2 puffs into the lungs daily.    . naproxen sodium (ANAPROX) 220 MG tablet Take 220 mg by mouth daily.     [name] omeprazole (PRILOSEC OTC) 20 MG tablet Take 20 mg by mouth daily.      No current facility-administered medications for this encounter.    BP 134/60 mmHg  Pulse 65  Wt 165 lb 12 oz (75.184 kg)  SpO2 97% General: NAD Neck: No JVD, no thyromegaly or thyroid nodule.  Lungs: Rhonchi that cleared after coughing.  CV: Nondisplaced PMI. Heart regular S1/S2, no S3/S4, no murmur. No peripheral edema. No carotid bruit. Normal pedal pulses.  Abdomen: Soft, nontender, no hepatosplenomegaly, no distention.  Neurologic: Alert and oriented x 3.  Psych: Normal affect. Extremities: No clubbing or cyanosis.   Assessment/Plan: 1. CAD: S/p CABG then redo CABG. Current symptoms are very concerning for unstable angina versus NSTEMI (given prolonged pain last night and this morning). I told him that he needs to be admitted.  - Admit to telemetry today.  - Draw troponin level and cycle.  - Continue ASA 81, atenolol,  atorvastatin, lisinopril.  - If troponin is elevated, will use heparin gtt.  - Will need cardiac cath, will arrange for tomorrow. Discussed risks/benefits of the procedure with him and he agrees to proceed with cath.  2. Hyperlipidemia: Continue atorvastatin.   [name] 05/18/2015  Troponin elevated, start heparin gtt.   [name] 05/18/2015

## 2015-05-19 ENCOUNTER — Encounter (HOSPITAL_COMMUNITY): Payer: Self-pay

## 2015-05-19 ENCOUNTER — Encounter (HOSPITAL_COMMUNITY)
Admission: AD | Disposition: A | Discharge status: Home / Self Care | Payer: Self-pay | Source: Ambulatory Visit | Attending: Cardiology

## 2015-05-19 ENCOUNTER — Inpatient Hospital Stay (HOSPITAL_COMMUNITY): Payer: Medicare Other

## 2015-05-19 DIAGNOSIS — Z951 Presence of aortocoronary bypass graft: Secondary | ICD-10-CM | POA: Diagnosis not present

## 2015-05-19 DIAGNOSIS — R001 Bradycardia, unspecified: Secondary | ICD-10-CM | POA: Diagnosis present

## 2015-05-19 DIAGNOSIS — J45909 Unspecified asthma, uncomplicated: Secondary | ICD-10-CM | POA: Diagnosis present

## 2015-05-19 DIAGNOSIS — K219 Gastro-esophageal reflux disease without esophagitis: Secondary | ICD-10-CM | POA: Diagnosis present

## 2015-05-19 DIAGNOSIS — Z87891 Personal history of nicotine dependence: Secondary | ICD-10-CM | POA: Diagnosis not present

## 2015-05-19 DIAGNOSIS — R0789 Other chest pain: Secondary | ICD-10-CM | POA: Diagnosis present

## 2015-05-19 DIAGNOSIS — I2510 Atherosclerotic heart disease of native coronary artery without angina pectoris: Secondary | ICD-10-CM | POA: Diagnosis not present

## 2015-05-19 DIAGNOSIS — I252 Old myocardial infarction: Secondary | ICD-10-CM | POA: Diagnosis not present

## 2015-05-19 DIAGNOSIS — E785 Hyperlipidemia, unspecified: Secondary | ICD-10-CM | POA: Diagnosis present

## 2015-05-19 DIAGNOSIS — Z791 Long term (current) use of non-steroidal anti-inflammatories (NSAID): Secondary | ICD-10-CM | POA: Diagnosis not present

## 2015-05-19 DIAGNOSIS — I10 Essential (primary) hypertension: Secondary | ICD-10-CM | POA: Diagnosis present

## 2015-05-19 DIAGNOSIS — I214 Non-ST elevation (NSTEMI) myocardial infarction: Principal | ICD-10-CM

## 2015-05-19 DIAGNOSIS — Z7982 Long term (current) use of aspirin: Secondary | ICD-10-CM | POA: Diagnosis not present

## 2015-05-19 DIAGNOSIS — Z7951 Long term (current) use of inhaled steroids: Secondary | ICD-10-CM | POA: Diagnosis not present

## 2015-05-19 DIAGNOSIS — Z8249 Family history of ischemic heart disease and other diseases of the circulatory system: Secondary | ICD-10-CM | POA: Diagnosis not present

## 2015-05-19 HISTORY — PX: CARDIAC CATHETERIZATION: SHX172

## 2015-05-19 LAB — CBC
HEMATOCRIT: 43.2 % (ref 39.0–52.0)
Hemoglobin: 13.9 g/dL (ref 13.0–17.0)
MCH: 29.2 pg (ref 26.0–34.0)
MCHC: 32.2 g/dL (ref 30.0–36.0)
MCV: 90.8 fL (ref 78.0–100.0)
PLATELETS: 140 10*3/uL — AB (ref 150–400)
RBC: 4.76 MIL/uL (ref 4.22–5.81)
RDW: 12.6 % (ref 11.5–15.5)
WBC: 7.6 10*3/uL (ref 4.0–10.5)

## 2015-05-19 LAB — BASIC METABOLIC PANEL
Anion gap: 12 (ref 5–15)
BUN: 17 mg/dL (ref 6–20)
CALCIUM: 9.6 mg/dL (ref 8.9–10.3)
CO2: 25 mmol/L (ref 22–32)
CREATININE: 0.91 mg/dL (ref 0.61–1.24)
Chloride: 104 mmol/L (ref 101–111)
GFR calc Af Amer: >60 mL/min (ref >60)
GLUCOSE: 96 mg/dL (ref 65–99)
Potassium: 4 mmol/L (ref 3.5–5.1)
Sodium: 141 mmol/L (ref 135–145)

## 2015-05-19 LAB — PROTIME-INR
INR: 1.21 (ref 0.00–1.49)
Prothrombin Time: 15.5 s — ABNORMAL HIGH (ref 11.6–15.2)

## 2015-05-19 LAB — HEPARIN LEVEL (UNFRACTIONATED): Heparin Unfractionated: 1.06 IU/mL — ABNORMAL HIGH (ref 0.30–0.70)

## 2015-05-19 LAB — POCT ACTIVATED CLOTTING TIME: Activated Clotting Time: 137 seconds

## 2015-05-19 SURGERY — LEFT HEART CATH AND CORS/GRAFTS ANGIOGRAPHY

## 2015-05-19 MED ORDER — SODIUM CHLORIDE 0.9 % IV SOLN: INTRAVENOUS | Status: DC

## 2015-05-19 MED ORDER — CLOPIDOGREL BISULFATE 75 MG PO TABS
300.0000 mg | ORAL_TABLET | Freq: Once | ORAL | Status: AC
  Administered 2015-05-19: 300 mg via ORAL
  Filled 2015-05-19: qty 4

## 2015-05-19 MED ORDER — MIDAZOLAM HCL 2 MG/2ML IJ SOLN
INTRAMUSCULAR | Status: AC
  Filled 2015-05-19: qty 2

## 2015-05-19 MED ORDER — HEPARIN SODIUM (PORCINE) 5000 UNIT/ML IJ SOLN
5000.0000 [IU] | Freq: Three times a day (TID) | INTRAMUSCULAR | Status: DC
  Administered 2015-05-19 – 2015-05-20 (×2): 5000 [IU] via SUBCUTANEOUS
  Filled 2015-05-19 (×2): qty 1

## 2015-05-19 MED ORDER — MIDAZOLAM HCL 2 MG/2ML IJ SOLN
INTRAMUSCULAR | Status: DC | PRN
  Administered 2015-05-19: 1 mg via INTRAVENOUS

## 2015-05-19 MED ORDER — SODIUM CHLORIDE 0.9% FLUSH
3.0000 mL | Freq: Two times a day (BID) | INTRAVENOUS | Status: DC
Start: 2015-05-19 — End: 2015-05-19
  Administered 2015-05-19: 3 mL via INTRAVENOUS

## 2015-05-19 MED ORDER — FENTANYL CITRATE (PF) 100 MCG/2ML IJ SOLN
INTRAMUSCULAR | Status: AC
  Filled 2015-05-19: qty 2

## 2015-05-19 MED ORDER — HEPARIN (PORCINE) IN NACL 2-0.9 UNIT/ML-% IJ SOLN
INTRAMUSCULAR | Status: AC
  Filled 2015-05-19: qty 1000

## 2015-05-19 MED ORDER — SODIUM CHLORIDE 0.9% FLUSH
3.0000 mL | Freq: Two times a day (BID) | INTRAVENOUS | Status: DC
  Administered 2015-05-19: 3 mL via INTRAVENOUS

## 2015-05-19 MED ORDER — CLOPIDOGREL BISULFATE 75 MG PO TABS
75.0000 mg | ORAL_TABLET | Freq: Every day | ORAL | Status: DC
  Administered 2015-05-20: 75 mg via ORAL
  Filled 2015-05-19: qty 1

## 2015-05-19 MED ORDER — HEPARIN (PORCINE) IN NACL 100-0.45 UNIT/ML-% IJ SOLN
750.0000 [IU]/h | INTRAMUSCULAR | Status: DC
  Administered 2015-05-19: 750 [IU]/h via INTRAVENOUS

## 2015-05-19 MED ORDER — IOHEXOL 350 MG/ML SOLN
INTRAVENOUS | Status: DC | PRN
  Administered 2015-05-19: 105 mL via INTRA_ARTERIAL

## 2015-05-19 MED ORDER — ISOSORBIDE MONONITRATE ER 30 MG PO TB24
30.0000 mg | ORAL_TABLET | Freq: Every day | ORAL | Status: DC
  Administered 2015-05-19 – 2015-05-20 (×2): 30 mg via ORAL
  Filled 2015-05-19 (×2): qty 1

## 2015-05-19 MED ORDER — ASPIRIN 81 MG PO CHEW: 81.0000 mg | CHEWABLE_TABLET | ORAL | Status: DC

## 2015-05-19 MED ORDER — SODIUM CHLORIDE 0.9 % IV SOLN: 250.0000 mL | INTRAVENOUS | Status: DC | PRN

## 2015-05-19 MED ORDER — SODIUM CHLORIDE 0.9% FLUSH: 3.0000 mL | INTRAVENOUS | Status: DC | PRN

## 2015-05-19 MED ORDER — SODIUM CHLORIDE 0.9 % IV SOLN
INTRAVENOUS | Status: AC
  Administered 2015-05-19: 11:00:00 via INTRAVENOUS

## 2015-05-19 MED ORDER — FENTANYL CITRATE (PF) 100 MCG/2ML IJ SOLN
INTRAMUSCULAR | Status: DC | PRN
  Administered 2015-05-19: 25 ug via INTRAVENOUS

## 2015-05-19 MED ORDER — SODIUM CHLORIDE 0.9 % WEIGHT BASED INFUSION: 1.0000 mL/kg/h | INTRAVENOUS | Status: AC

## 2015-05-19 MED ORDER — ACETAMINOPHEN 325 MG PO TABS: 650.0000 mg | ORAL_TABLET | ORAL | Status: DC | PRN

## 2015-05-19 MED ORDER — ATORVASTATIN CALCIUM 80 MG PO TABS
80.0000 mg | ORAL_TABLET | Freq: Every day | ORAL | Status: DC
  Administered 2015-05-19: 80 mg via ORAL
  Filled 2015-05-19: qty 1

## 2015-05-19 MED ORDER — ASPIRIN 81 MG PO CHEW
81.0000 mg | CHEWABLE_TABLET | ORAL | Status: AC
  Administered 2015-05-19: 81 mg via ORAL
  Filled 2015-05-19: qty 1

## 2015-05-19 MED ORDER — ONDANSETRON HCL 4 MG/2ML IJ SOLN: 4.0000 mg | Freq: Four times a day (QID) | INTRAMUSCULAR | Status: DC | PRN

## 2015-05-19 MED ORDER — HEPARIN (PORCINE) IN NACL 2-0.9 UNIT/ML-% IJ SOLN
INTRAMUSCULAR | Status: DC | PRN
  Administered 2015-05-19: 1000 mL

## 2015-05-19 MED ORDER — ISOSORBIDE MONONITRATE ER 30 MG PO TB24: 30.0000 mg | ORAL_TABLET | Freq: Every day | ORAL | Status: DC

## 2015-05-19 MED ORDER — LIDOCAINE HCL (PF) 1 % IJ SOLN
INTRAMUSCULAR | Status: DC | PRN
  Administered 2015-05-19: 15 mL

## 2015-05-19 MED ORDER — ASPIRIN EC 81 MG PO TBEC
81.0000 mg | DELAYED_RELEASE_TABLET | Freq: Every day | ORAL | Status: DC
  Administered 2015-05-20: 81 mg via ORAL
  Filled 2015-05-19: qty 1

## 2015-05-19 MED ORDER — LIDOCAINE HCL (PF) 1 % IJ SOLN
INTRAMUSCULAR | Status: AC
Start: 2015-05-19 — End: 2015-05-19
  Filled 2015-05-19: qty 30

## 2015-05-19 SURGICAL SUPPLY — 7 items
CATH INFINITI 5FR MULTPACK ANG (CATHETERS) ×3 IMPLANT
KIT HEART LEFT (KITS) ×3 IMPLANT
PACK CARDIAC CATHETERIZATION (CUSTOM PROCEDURE TRAY) ×3 IMPLANT
SHEATH PINNACLE 5F 10CM (SHEATH) ×3 IMPLANT
SYR MEDRAD MARK V 150ML (SYRINGE) ×3 IMPLANT
TRANSDUCER W/STOPCOCK (MISCELLANEOUS) ×3 IMPLANT
WIRE EMERALD 3MM-J .035X150CM (WIRE) ×3 IMPLANT

## 2015-05-19 NOTE — Progress Notes (Signed)
Patient ID: Ricardo Reed, male   DOB: 1931/12/14, 82 y.o.   MRN: [id number] Patient Information: 1. Suspected or Known ACS 2. UA/NSTEMI 3. High ACS Risk Score (e.g.,TIMI, GRACE) A (9) Indication: 3  [name] 05/19/2015

## 2015-05-19 NOTE — Progress Notes (Signed)
Telephone direction per Dr. Shirlee Latch - d/c continuous IV heparin orders; continue only with SQ heparin. Called and informed the pharmacist; stated she would adjust the heparin orders.

## 2015-05-19 NOTE — Progress Notes (Signed)
This RN called Cath Lab - stated will call for him w/in 15 minutes - confirmed to stop Heparin. Stopped @ 1500.

## 2015-05-19 NOTE — Progress Notes (Signed)
ANTICOAGULATION CONSULT NOTE - Follow-up Consult  Pharmacy Consult for heparin Indication: chest pain/ACS  No Known Allergies  Patient Measurements: Height:  (172.7 cm) IBW/kg (Calculated) : 68.4 Heparin Dosing Weight:  75 kg  Vital Signs: Temp: 98 F (36.7 C) (02/22 0432) Temp Source: Oral (02/22 0432) BP: 127/50 mmHg (02/22 0432) Pulse Rate: 58 (02/22 0432)  Labs:  Recent Labs  05/18/15 1433 05/18/15 1730 05/18/15 2007 05/18/15 2258 05/19/15 0200 05/19/15 0457  HGB 15.1 15.5  --   --   --  13.9  HCT 46.3 47.0  --   --   --  43.2  PLT 163 150  --   --   --  140*  LABPROT  --   --   --   --   --  15.5*  INR  --   --   --   --   --  1.21  HEPARINUNFRC  --   --   --   --   --  1.06*  CREATININE 0.90 0.81  --   --  0.91  --   TROPONINI 0.33* 0.33* 0.36* 0.43*  --   --     Estimated Creatinine Clearance: 61.6 mL/min (by C-G formula based on Cr of 0.91).   Assessment: 82yo male with CAD- CABG & redo CABG now presenting from Cardiology office visit with unstable angina vs NSTEMI; plans for cath today at 1430. Heparin level supratherapeutic (1.06) on 900 units/hr. RN verified that it was drawn from arm opposite where heparin infusing. No bleeding noted.  Goal of Therapy:  Heparin level 0.3-0.7 units/ml Monitor platelets by anticoagulation protocol: Yes   Plan:  Hold heparin x 1 hour then restart gtt at 750 units/hr F/u post cath  Christoper Fabian, PharmD, BCPS Clinical pharmacist, pager [phone] 05/19/2015 5:49 AM

## 2015-05-19 NOTE — H&P (View-Only) (Signed)
Patient ID: Ricardo Reed, male   DOB: 10/22/1931, 82 y.o.   MRN: [id number]   SUBJECTIVE: No further chest pain.  Mild increase in troponin.   Scheduled Meds: . [START ON 05/20/2015] aspirin EC  81 mg Oral Daily  . atenolol  50 mg Oral Daily  . atorvastatin  20 mg Oral q1800  . budesonide (PULMICORT) nebulizer solution  0.25 mg Nebulization BID  . lisinopril  40 mg Oral q1800  . pantoprazole  40 mg Oral q1800  . sodium chloride flush  3 mL Intravenous Q12H  . zolpidem  5 mg Oral QHS   Continuous Infusions: . sodium chloride    . sodium chloride    . heparin 750 Units/hr (05/19/15 0645)   PRN Meds:.sodium chloride, acetaminophen, gi cocktail, ondansetron (ZOFRAN) IV, sodium chloride flush    Filed Vitals:   05/18/15 2100 05/18/15 2113 05/19/15 0432 05/19/15 1012  BP:  153/57 127/50   Pulse:  54 58   Temp:  98 F (36.7 C) 98 F (36.7 C)   TempSrc:  Oral Oral   Resp:   18   Height: 5' 8.9" (1.75 m)   (1.727 m)   SpO2:  99% 94% 98%    Intake/Output Summary (Last 24 hours) at 05/19/15 1019 Last data filed at 05/19/15 0000  Gross per 24 hour  Intake    200 ml  Output      0 ml  Net    200 ml    LABS: Basic Metabolic Panel:  Recent Labs  19/14/78 1433 05/18/15 1730 05/19/15 0200  NA 141  --  141  K 5.1  --  4.0  CL 105  --  104  CO2 24  --  25  GLUCOSE 102*  --  96  BUN 19  --  17  CREATININE 0.90 0.81 0.91  CALCIUM 9.9  --  9.6   Liver Function Tests:  Recent Labs  05/18/15 1433  AST 37  ALT 37  ALKPHOS 90  BILITOT 1.1  PROT 7.2  ALBUMIN 4.2   No results for input(s): LIPASE, AMYLASE in the last 72 hours. CBC:  Recent Labs  05/18/15 1730 05/19/15 0457  WBC 9.8 7.6  HGB 15.5 13.9  HCT 47.0 43.2  MCV 90.2 90.8  PLT 150 140*   Cardiac Enzymes:  Recent Labs  05/18/15 1730 05/18/15 2007 05/18/15 2258  TROPONINI 0.33* 0.36* 0.43*   BNP: Invalid input(s): POCBNP D-Dimer: No results for input(s): DDIMER in the last 72  hours. Hemoglobin A1C: No results for input(s): HGBA1C in the last 72 hours. Fasting Lipid Panel: No results for input(s): CHOL, HDL, LDLCALC, TRIG, CHOLHDL, LDLDIRECT in the last 72 hours. Thyroid Function Tests: No results for input(s): TSH, T4TOTAL, T3FREE, THYROIDAB in the last 72 hours.  Invalid input(s): FREET3 Anemia Panel: No results for input(s): VITAMINB12, FOLATE, FERRITIN, TIBC, IRON, RETICCTPCT in the last 72 hours.  RADIOLOGY: No results found.  PHYSICAL EXAM General: NAD Neck: No JVD, no thyromegaly or thyroid nodule.  Lungs: Clear to auscultation bilaterally with normal respiratory effort. CV: Nondisplaced PMI.  Heart regular S1/S2, no S3/S4, no murmur.  No peripheral edema.  No carotid bruit.  Normal pedal pulses.  Abdomen: Soft, nontender, no hepatosplenomegaly, no distention.  Neurologic: Alert and oriented x 3.  Psych: Normal affect. Extremities: No clubbing or cyanosis.   TELEMETRY: Reviewed telemetry pt in NSR  ASSESSMENT AND PLAN: 1. CAD: S/p CABG then redo CABG. He had chest pain Monday and  night and was admitted Tuesday with NSTEMI.  Currently CP-free.  - Plan LHC today. I discussed risk/benefits with him, he agrees to proceed.  - Continue ASA, increase atorvastatin to 80 daily.  Continue atenolol. - Echo to assess LV systolic function.  - Pre-cath hydration with normal saline.   - Repeat ECG.  2. Hyperlipidemia: Continue atorvastatin.   [name] 05/19/2015 10:22 AM

## 2015-05-19 NOTE — Progress Notes (Signed)
Site area: rt groin sheath removed and pressure held by [employer] Prior to Removal:  Level 0 Pressure Applied For:  20 minutes Manual:   yes Patient Status During Pull:  stable Post Pull Site:  Level  0 Post Pull Instructions Given:  yes Post Pull Pulses Present: yes Dressing Applied:  tegaderm Bedrest begins @ 1735 Comments:

## 2015-05-19 NOTE — Progress Notes (Signed)
Pharmacy requested clarification r/t multiple IV and SQ heparin orders. Dr. Shirlee Latch paged.

## 2015-05-19 NOTE — Interval H&P Note (Signed)
Cath Lab Visit (complete for each Cath Lab visit)  Clinical Evaluation Leading to the Procedure:   ACS: Yes.    Non-ACS:    Anginal Classification: CCS IV  Anti-ischemic medical therapy: Minimal Therapy (1 class of medications)  Non-Invasive Test Results: No non-invasive testing performed  Prior CABG: Previous CABG      History and Physical Interval Note:  05/19/2015 4:11 PM  Ricardo Reed  has presented today for surgery, with the diagnosis of chf  The various methods of treatment have been discussed with the patient and family. After consideration of risks, benefits and other options for treatment, the patient has consented to  Procedure(s): Left Heart Cath and Cors/Grafts Angiography (N/A) as a surgical intervention .  The patient's history has been reviewed, patient examined, no change in status, stable for surgery.  I have reviewed the patient's chart and labs.  Questions were answered to the patient's satisfaction.     Ricardo Reed [employer]

## 2015-05-19 NOTE — Progress Notes (Addendum)
Patient ID: Ricardo Reed, male   DOB: 10/22/1931, 82 y.o.   MRN: [id number]   SUBJECTIVE: No further chest pain.  Mild increase in troponin.   Scheduled Meds: . [START ON 05/20/2015] aspirin EC  81 mg Oral Daily  . atenolol  50 mg Oral Daily  . atorvastatin  20 mg Oral q1800  . budesonide (PULMICORT) nebulizer solution  0.25 mg Nebulization BID  . lisinopril  40 mg Oral q1800  . pantoprazole  40 mg Oral q1800  . sodium chloride flush  3 mL Intravenous Q12H  . zolpidem  5 mg Oral QHS   Continuous Infusions: . sodium chloride    . sodium chloride    . heparin 750 Units/hr (05/19/15 0645)   PRN Meds:.sodium chloride, acetaminophen, gi cocktail, ondansetron (ZOFRAN) IV, sodium chloride flush    Filed Vitals:   05/18/15 2100 05/18/15 2113 05/19/15 0432 05/19/15 1012  BP:  153/57 127/50   Pulse:  54 58   Temp:  98 F (36.7 C) 98 F (36.7 C)   TempSrc:  Oral Oral   Resp:   18   Height: 5' 8.9" (1.75 m)   (1.727 m)   SpO2:  99% 94% 98%    Intake/Output Summary (Last 24 hours) at 05/19/15 1019 Last data filed at 05/19/15 0000  Gross per 24 hour  Intake    200 ml  Output      0 ml  Net    200 ml    LABS: Basic Metabolic Panel:  Recent Labs  19/14/78 1433 05/18/15 1730 05/19/15 0200  NA 141  --  141  K 5.1  --  4.0  CL 105  --  104  CO2 24  --  25  GLUCOSE 102*  --  96  BUN 19  --  17  CREATININE 0.90 0.81 0.91  CALCIUM 9.9  --  9.6   Liver Function Tests:  Recent Labs  05/18/15 1433  AST 37  ALT 37  ALKPHOS 90  BILITOT 1.1  PROT 7.2  ALBUMIN 4.2   No results for input(s): LIPASE, AMYLASE in the last 72 hours. CBC:  Recent Labs  05/18/15 1730 05/19/15 0457  WBC 9.8 7.6  HGB 15.5 13.9  HCT 47.0 43.2  MCV 90.2 90.8  PLT 150 140*   Cardiac Enzymes:  Recent Labs  05/18/15 1730 05/18/15 2007 05/18/15 2258  TROPONINI 0.33* 0.36* 0.43*   BNP: Invalid input(s): POCBNP D-Dimer: No results for input(s): DDIMER in the last 72  hours. Hemoglobin A1C: No results for input(s): HGBA1C in the last 72 hours. Fasting Lipid Panel: No results for input(s): CHOL, HDL, LDLCALC, TRIG, CHOLHDL, LDLDIRECT in the last 72 hours. Thyroid Function Tests: No results for input(s): TSH, T4TOTAL, T3FREE, THYROIDAB in the last 72 hours.  Invalid input(s): FREET3 Anemia Panel: No results for input(s): VITAMINB12, FOLATE, FERRITIN, TIBC, IRON, RETICCTPCT in the last 72 hours.  RADIOLOGY: No results found.  PHYSICAL EXAM General: NAD Neck: No JVD, no thyromegaly or thyroid nodule.  Lungs: Clear to auscultation bilaterally with normal respiratory effort. CV: Nondisplaced PMI.  Heart regular S1/S2, no S3/S4, no murmur.  No peripheral edema.  No carotid bruit.  Normal pedal pulses.  Abdomen: Soft, nontender, no hepatosplenomegaly, no distention.  Neurologic: Alert and oriented x 3.  Psych: Normal affect. Extremities: No clubbing or cyanosis.   TELEMETRY: Reviewed telemetry pt in NSR  ASSESSMENT AND PLAN: 1. CAD: S/p CABG then redo CABG. He had chest pain Monday and  night and was admitted Tuesday with NSTEMI.  Currently CP-free.  - Plan LHC today. I discussed risk/benefits with him, he agrees to proceed.  - Continue ASA, increase atorvastatin to 80 daily.  Continue atenolol. - Echo to assess LV systolic function.  - Pre-cath hydration with normal saline.   - Repeat ECG.  2. Hyperlipidemia: Continue atorvastatin.   Jatinder Mcdonagh 05/19/2015 10:22 AM  

## 2015-05-20 ENCOUNTER — Encounter (HOSPITAL_COMMUNITY): Payer: Self-pay | Admitting: Cardiology

## 2015-05-20 ENCOUNTER — Inpatient Hospital Stay (HOSPITAL_COMMUNITY): Payer: Medicare Other

## 2015-05-20 DIAGNOSIS — I2510 Atherosclerotic heart disease of native coronary artery without angina pectoris: Secondary | ICD-10-CM

## 2015-05-20 LAB — BASIC METABOLIC PANEL
ANION GAP: 12 (ref 5–15)
BUN: 17 mg/dL (ref 6–20)
CALCIUM: 9 mg/dL (ref 8.9–10.3)
CO2: 25 mmol/L (ref 22–32)
Chloride: 103 mmol/L (ref 101–111)
Creatinine, Ser: 1.1 mg/dL (ref 0.61–1.24)
GLUCOSE: 107 mg/dL — AB (ref 65–99)
POTASSIUM: 4 mmol/L (ref 3.5–5.1)
SODIUM: 140 mmol/L (ref 135–145)

## 2015-05-20 LAB — TSH: TSH: 2.344 u[IU]/mL (ref 0.350–4.500)

## 2015-05-20 LAB — CBC
HCT: 43 % (ref 39.0–52.0)
Hemoglobin: 14.2 g/dL (ref 13.0–17.0)
MCH: 30.3 pg (ref 26.0–34.0)
MCHC: 33 g/dL (ref 30.0–36.0)
MCV: 91.9 fL (ref 78.0–100.0)
PLATELETS: 159 10*3/uL (ref 150–400)
RBC: 4.68 MIL/uL (ref 4.22–5.81)
RDW: 12.8 % (ref 11.5–15.5)
WBC: 6.6 10*3/uL (ref 4.0–10.5)

## 2015-05-20 MED ORDER — ISOSORBIDE MONONITRATE ER 30 MG PO TB24
30.0000 mg | ORAL_TABLET | Freq: Every day | ORAL | Status: DC
Start: 2015-05-20 — End: 2015-05-20

## 2015-05-20 MED ORDER — PANTOPRAZOLE SODIUM 40 MG PO TBEC: 40.0000 mg | DELAYED_RELEASE_TABLET | Freq: Every day | ORAL | Status: DC

## 2015-05-20 MED ORDER — CLOPIDOGREL BISULFATE 75 MG PO TABS: 75.0000 mg | ORAL_TABLET | Freq: Every day | ORAL | Status: DC

## 2015-05-20 MED ORDER — ATORVASTATIN CALCIUM 80 MG PO TABS: 80.0000 mg | ORAL_TABLET | Freq: Every day | ORAL | Status: AC

## 2015-05-20 MED ORDER — ISOSORBIDE MONONITRATE ER 30 MG PO TB24: 30.0000 mg | ORAL_TABLET | Freq: Every day | ORAL | Status: DC

## 2015-05-20 MED ORDER — PANTOPRAZOLE SODIUM 40 MG PO TBEC: 40.0000 mg | DELAYED_RELEASE_TABLET | Freq: Every day | ORAL | Status: AC

## 2015-05-20 NOTE — Progress Notes (Signed)
Patient ID: Ricardo Reed, male   DOB: 1931/09/18, 82 y.o.   MRN: [id number]   SUBJECTIVE: No further chest pain.  Walked in hall last night without trouble.  LHC:  1. All bypass grafts from CABG #2 patent: SVG-D, SVG-PLOM, and LIMA-LAD.  2. 95% mid-vessel stenosis in co-dominant, relatively small RCA, no change.  3. New 99% stenosis in very short left main. On prior cath, left main was patent. There is a small septal perforator, small diagonal, and small OM1 that are at risk from this. Remainder of the LAD and LCx systems should have good supply from bypass grafts. I suspect that this was the cause of his NSTEMI. This would be very difficult to intervene upon for the sake of a small territory. Would manage medically. Will add Imdur 30 daily and start Plavix.   Scheduled Meds: . aspirin EC  81 mg Oral Daily  . atenolol  50 mg Oral Daily  . atorvastatin  80 mg Oral q1800  . budesonide (PULMICORT) nebulizer solution  0.25 mg Nebulization BID  . clopidogrel  75 mg Oral Daily  . heparin  5,000 Units Subcutaneous 3 times per day  . isosorbide mononitrate  30 mg Oral Daily  . lisinopril  40 mg Oral q1800  . pantoprazole  40 mg Oral q1800  . sodium chloride flush  3 mL Intravenous Q12H  . zolpidem  5 mg Oral QHS   Continuous Infusions:   PRN Meds:.sodium chloride, acetaminophen, gi cocktail, ondansetron (ZOFRAN) IV, sodium chloride flush    Filed Vitals:   05/19/15 1925 05/19/15 1926 05/19/15 2137 05/20/15 0521  BP: 149/59  132/51 100/49  Pulse: 9  51   Temp:   98.1 F (36.7 C) 98.3 F (36.8 C)  TempSrc:   Oral Oral  Resp:   20 18  Height:      SpO2: 58% 96% 96% 94%    Intake/Output Summary (Last 24 hours) at 05/20/15 0724 Last data filed at 05/19/15 1110  Gross per 24 hour  Intake      6 ml  Output      0 ml  Net      6 ml    LABS: Basic Metabolic Panel:  Recent Labs  40/98/11 0200 05/20/15 0330  NA 141 140  K 4.0 4.0  CL 104 103  CO2 25 25  GLUCOSE 96  107*  BUN 17 17  CREATININE 0.91 1.10  CALCIUM 9.6 9.0   Liver Function Tests:  Recent Labs  05/18/15 1433  AST 37  ALT 37  ALKPHOS 90  BILITOT 1.1  PROT 7.2  ALBUMIN 4.2   No results for input(s): LIPASE, AMYLASE in the last 72 hours. CBC:  Recent Labs  05/19/15 0457 05/20/15 0330  WBC 7.6 6.6  HGB 13.9 14.2  HCT 43.2 43.0  MCV 90.8 91.9  PLT 140* 159   Cardiac Enzymes:  Recent Labs  05/18/15 1730 05/18/15 2007 05/18/15 2258  TROPONINI 0.33* 0.36* 0.43*   BNP: Invalid input(s): POCBNP D-Dimer: No results for input(s): DDIMER in the last 72 hours. Hemoglobin A1C: No results for input(s): HGBA1C in the last 72 hours. Fasting Lipid Panel: No results for input(s): CHOL, HDL, LDLCALC, TRIG, CHOLHDL, LDLDIRECT in the last 72 hours. Thyroid Function Tests:  Recent Labs  05/20/15 0330  TSH 2.344   Anemia Panel: No results for input(s): VITAMINB12, FOLATE, FERRITIN, TIBC, IRON, RETICCTPCT in the last 72 hours.  RADIOLOGY: No results found.  PHYSICAL EXAM General: NAD Neck:  No JVD, no thyromegaly or thyroid nodule.  Lungs: Clear to auscultation bilaterally with normal respiratory effort. CV: Nondisplaced PMI.  Heart regular S1/S2, no S3/S4, no murmur.  No peripheral edema.  No carotid bruit.  Normal pedal pulses.  Abdomen: Soft, nontender, no hepatosplenomegaly, no distention.  Neurologic: Alert and oriented x 3.  Psych: Normal affect. Extremities: No clubbing or cyanosis.   TELEMETRY: Reviewed telemetry pt in NSR  ASSESSMENT AND PLAN: 1. CAD: S/p CABG then redo CABG. He had chest pain Monday and night and was admitted Tuesday with NSTEMI.  Angiography showed all bypass grafts from CABG #2 patent.  He had 99% stenosis of ostial left main (very short vessel) that was a new finding. There is a small septal perforator, small diagonal, and small OM1 that are at risk from this. Remainder of the LAD and LCx systems should have good supply from bypass  grafts. I suspect that this was the cause of his NSTEMI. This would be very difficult to intervene upon for the sake of a small territory.  - I started Imdur 30 mg daily, no further chest pain.   - Continue ASA, increased atorvastatin to 80 daily.  Continue atenolol. - Started Plavix, continue for at least a year.  - Echo to assess LV systolic function, need to do this morning before discharge because we only did a hand LV-gram in cath lab with elevated LVEDP.  2. Hyperlipidemia: Continue atorvastatin.  3. Disposition: May go home today after echo.  Followup with me in 2 wks, may overbook into [employer].  Would like him to do cardiac rehab if available in [place].  Home meds: atenolol 50 daily, ASA 81 daily, Plavix 75 daily, Imdur 30 daily, atorvastatin 80 daily, lisinopril 40 daily.   [name] 05/20/2015 7:24 AM

## 2015-05-20 NOTE — Progress Notes (Signed)
Removed IV's. Reviewed discharge instructions with patient and family. Escorted patient out.  Iyan Flett, Charlaine Dalton RN

## 2015-05-20 NOTE — Progress Notes (Signed)
  Echocardiogram 2D Echocardiogram has been performed.  Ricardo Reed 05/20/2015, 11:59 AM

## 2015-05-20 NOTE — Discharge Summary (Signed)
Discharge Summary    Patient ID: Ricardo Reed,  MRN: [id number], DOB/AGE: 1931/08/28 82 y.o.  Admit date: 05/18/2015 Discharge date: 05/20/2015  Primary Care Provider: No primary care provider on file. Primary Cardiologist:   Shirlee Latch  Discharge Diagnoses    Active Problems:   Chest pain   NSTEMI   Coronary artery disease   History of coronary artery bypass grafting with redo coronary artery bypass grafting   Hyperlipidemia  Allergies No Known Allergies  Diagnostic Studies/Procedures     Echocardiogram Study Conclusions  - Left ventricle: The cavity size was normal. There was mild focal basal hypertrophy of the septum. Systolic function was mildly to moderately reduced. The estimated ejection fraction was in the range of 40% to 45%. There is akinesis of the inferolateral and inferior myocardium. Features are consistent with a pseudonormal left ventricular filling pattern, with concomitant abnormal relaxation and increased filling pressure (grade 2 diastolic dysfunction). Doppler parameters are consistent with high ventricular filling pressure. - Mitral valve: Calcified annulus. There was mild to moderate regurgitation. - Left atrium: The atrium was mildly dilated. - Pulmonary arteries: Systolic pressure was mildly to moderately increased. PA peak pressure: 55 mm Hg (S).  Impressions:  - Akinesis of the inferior and inferior lateral wall; overall mild to moderate LV dysfunction; grade 2 diastolic dysfunction with elevated LV filling pressure; mild LAE; mild to moderate MR; mild TR; mild to moderate elevation in pulmonary pressure.   Cardiac cath Conclusion    1. All bypass grafts from CABG #2 patent: SVG-D, SVG-PLOM, and LIMA-LAD.  2. 95% mid-vessel stenosis in co-dominant, relatively small RCA, no change.  3. New 99% stenosis in very short left main. On prior cath, left main was patent. There is a small septal perforator, small  diagonal, and small OM1 that are at risk from this. Remainder of the LAD and LCx systems should have good supply from bypass grafts. I suspect that this was the cause of his NSTEMI. This would be very difficult to intervene upon for the sake of a small territory. Would manage medically. Will add Imdur 30 daily and start Plavix.     _____________   History of Present Illness     82 yo with history of CAD s/p CABG and redo CABG presented for cardiology followup. He had his redo CABG in 5/10. Since around the first of the year, he has been having chest tightness episodes. These are in the lower chest/epigastric area. This is new for him, had not had chest pain since his redo CABG in 2010. He has noted the chest tightness playing golf at times, usually mild. He noted it more strongly picking up pine cones in his yard. He also has episodes that occur at rest. Last night, while getting ready for bed, he had very severe epigstric/lower chest tightness that lasted about 20 minutes. It was reminiscent of his prior MI. It resolved and he went to bed. He woke up this morning with mild, 2/10 pain. It lingered for several hours but resolved by noon. Currently, no symptoms.   ECG: NSR, T wave flattening laterally and inferiorly (some difference from prior).   Hospital Course     Consultants: Cardiac rehabilitation  Patient was admitted and scheduled for left heart catheterization. He was continued on aspirin, atenolol, Lipitor, lisinopril.  Left heart catheterization revealed all bypass grafts from CABG #2 patent: SVG-D, SVG-PLOM, and LIMA-LAD. 95% mid-vessel stenosis in co-dominant, relatively small RCA, no change. New 99% stenosis in very  short left main. On prior cath, left main was patent. There is a small septal perforator, small diagonal, and small OM1 that are at risk from this. Remainder of the LAD and LCx systems should have good supply from bypass grafts. Dr. Shirlee Latch suspects that  this was the cause of his NSTEMI. This would be very difficult to intervene upon for the sake of a small territory. Would manage medically.  Imdur 30 daily was added and Plavix started.  lipitor was increased 80 mg. Echocardiogram was completed and revealed... _____________  Discharge Vitals Blood pressure 100/49, pulse 50, temperature 98.3 F (36.8 C), temperature source Oral, resp. rate 18, height 5\' 8"  (1.727 m), SpO2 93 %.  There were no vitals filed for this visit.  Labs & Radiologic Studies     CBC  Recent Labs  05/19/15 0457 05/20/15 0330  WBC 7.6 6.6  HGB 13.9 14.2  HCT 43.2 43.0  MCV 90.8 91.9  PLT 140* 159   Basic Metabolic Panel  Recent Labs  05/19/15 0200 05/20/15 0330  NA 141 140  K 4.0 4.0  CL 104 103  CO2 25 25  GLUCOSE 96 107*  BUN 17 17  CREATININE 0.91 1.10  CALCIUM 9.6 9.0   Liver Function Tests  Recent Labs  05/18/15 1433  AST 37  ALT 37  ALKPHOS 90  BILITOT 1.1  PROT 7.2  ALBUMIN 4.2   No results for input(s): LIPASE, AMYLASE in the last 72 hours. Cardiac Enzymes  Recent Labs  05/18/15 1730 05/18/15 2007 05/18/15 2258  TROPONINI 0.33* 0.36* 0.43*     Recent Labs  05/20/15 0330  TSH 2.344    No results found.  Disposition   Pt is being discharged home today in good condition.  Follow-up Plans & Appointments    Follow-up Information    Follow up with [name], MD On 06/08/2015.   Specialty:  Cardiology   Why:  12:45 PM   Contact information:   [address] [place] [address] [phone]      Discharge Instructions    Amb Referral to Cardiac Rehabilitation    Complete by:  As directed   Diagnosis:  Myocardial Infarction Comment - referring to [facility]      Diet - low sodium heart healthy    Complete by:  As directed      Discharge instructions    Complete by:  As directed   No lifting more than a half gallon of milk or driving for three days.     Increase  activity slowly    Complete by:  As directed            Discharge Medications   Current Discharge Medication List    START taking these medications   Details  clopidogrel (PLAVIX) 75 MG tablet Take 1 tablet (75 mg total) by mouth daily. Qty: 30 tablet, Refills: 11    isosorbide mononitrate (IMDUR) 30 MG 24 hr tablet Take 1 tablet (30 mg total) by mouth daily. Qty: 30 tablet, Refills: 11    pantoprazole (PROTONIX) 40 MG tablet Take 1 tablet (40 mg total) by mouth daily at 6 PM. Qty: 30 tablet, Refills: 5      CONTINUE these medications which have CHANGED   Details  atorvastatin (LIPITOR) 80 MG tablet Take 1 tablet (80 mg total) by mouth daily at 6 PM. Qty: 30 tablet, Refills: 11      CONTINUE these medications which have NOT CHANGED  Details  albuterol (PROVENTIL HFA) 108 (90 BASE) MCG/ACT inhaler Inhale 1 puff into the lungs as needed for wheezing or shortness of breath.     aspirin 81 MG tablet Take 81 mg by mouth daily.      atenolol (TENORMIN) 50 MG tablet Take 50 mg by mouth daily.    diphenhydrAMINE (BENADRYL) 25 MG tablet Take 25 mg by mouth at bedtime.    fluticasone (FLONASE) 50 MCG/ACT nasal spray Place 1 spray into both nostrils at bedtime. DAILY    lisinopril (PRINIVIL,ZESTRIL) 40 MG tablet Take 1 tablet (40 mg total) by mouth daily. Qty: 90 tablet, Refills: 4   Associated Diagnoses: Coronary atherosclerosis of unspecified type of bypass graft(414.05)    mometasone (ASMANEX) 220 MCG/INH inhaler Inhale 2 puffs into the lungs daily.    naproxen sodium (ANAPROX) 220 MG tablet Take 220 mg by mouth daily.        STOP taking these medications     omeprazole (PRILOSEC OTC) 20 MG tablet          Aspirin prescribed at discharge?  Yes High Intensity Statin Prescribed? (Lipitor 40-80mg  or Crestor 20-40mg ): Yes Beta Blocker Prescribed? Yes For EF 45% or less, Was ACEI/ARB Prescribed? NA ADP Receptor Inhibitor Prescribed? (i.e. Plavix etc.-Includes Medically  Managed Patients): Yes For EF <40%, Aldosterone Inhibitor Prescribed? NA Was EF assessed during THIS hospitalization? Yes Was Cardiac Rehab II ordered? (Included Medically managed Patients): Yes   Outstanding Labs/Studies     Duration of Discharge Encounter   Greater than 30 minutes including physician time.  Burt Ek PAC 05/20/2015, 1:37 PM

## 2015-05-20 NOTE — Progress Notes (Signed)
CARDIAC REHAB PHASE I   PRE:  Rate/Rhythm: 51 SB  BP:  Supine:   Sitting: 120/50  Standing:    SaO2:   MODE:  Ambulation: 800 ft   POST:  Rate/Rhythm: 73 SR  BP:  Supine:   Sitting: 134/70  Standing:    SaO2:  0915-1000 Pt walked 800 ft with steady gait. No CP. Tolerated well. MI ed done with pt voicing understanding. Reviewed NTG use, gave heart healthy diet sheet and written ex ed. Discussed CRP 2 and will refer to [facility] in [place]. Pt will consider as he is very active and may want to ex on his own.   Luetta Nutting, RN BSN  05/20/2015 9:55 AM

## 2015-06-04 ENCOUNTER — Encounter: Payer: Self-pay | Admitting: *Deleted

## 2015-06-08 ENCOUNTER — Encounter: Payer: Self-pay | Admitting: Cardiology

## 2015-06-08 ENCOUNTER — Ambulatory Visit (INDEPENDENT_AMBULATORY_CARE_PROVIDER_SITE_OTHER): Payer: Medicare Other | Admitting: Cardiology

## 2015-06-08 VITALS — BP 132/60 | HR 60 | Ht 68.0 in | Wt 165.0 lb

## 2015-06-08 DIAGNOSIS — I255 Ischemic cardiomyopathy: Secondary | ICD-10-CM

## 2015-06-08 DIAGNOSIS — I10 Essential (primary) hypertension: Secondary | ICD-10-CM | POA: Diagnosis not present

## 2015-06-08 DIAGNOSIS — I208 Other forms of angina pectoris: Secondary | ICD-10-CM

## 2015-06-08 MED ORDER — CARVEDILOL 12.5 MG PO TABS: 12.5000 mg | ORAL_TABLET | Freq: Two times a day (BID) | ORAL | Status: DC

## 2015-06-08 MED ORDER — ISOSORBIDE MONONITRATE ER 30 MG PO TB24: 60.0000 mg | ORAL_TABLET | Freq: Every day | ORAL | Status: DC

## 2015-06-08 NOTE — Patient Instructions (Signed)
Medication Instructions:  Your physician has recommended you make the following change in your medication:  Increase Imdur to 60 mg by mouth daily.  If this helps chest pain continue on this dose. If not decrease back to 30 mg daily.  Stop atenolol. Start Coreg 12.5 mg by mouth twice daily.   Labwork: none  Testing/Procedures: non  Follow-Up: Your physician wants you to follow-up in: 6 months.  You will receive a reminder letter in the mail two months in advance. If you don't receive a letter, please call our office to schedule the follow-up appointment.   Any Other Special Instructions Will Be Listed Below (If Applicable).     If you need a refill on your cardiac medications before your next appointment, please call your pharmacy.

## 2015-06-08 NOTE — Progress Notes (Signed)
Patient ID: Ricardo Reed, male   DOB: 1932/02/24, 82 y.o.   MRN: [id number]  82 yo with history of CAD s/p CABG and redo CABG presents for cardiology followup.  He had his redo CABG in 5/10.  In 2/17, he had NSTEMI with LHC showing all the bypass grafts from CABG #2 patent, but he had culprit new 99% left main stenosis.  The LM was very short.  The territory at risk was very small due to patent bypass grafts.  PCI would have been extremely difficult, so he was managed medically.  Echo in 2/17 showed EF 40-45% with inferior/inferolateral akinesis (likely from a prior MI).  He was started on Plavix and Imdur.   Patient presents for followup today.  He is generally doing well.  He has occasional mild chest tightness, he noted dyspnea/mild chest tightness after walking about 1/4 mile.  He also gets epigastric discomfort associated with meals that he thinks is GERD.  Overall, the chest pain episodes are mild and becoming less frequent.  He is most limited by low back and right hip pain.   Labs (6/14): LDL 51, HDL 35, K 4.1, creatinine 0.8, HCT 48 Labs (6/15): LDL 43, HDL 36.5, K 4.2, creatinine 0.9, HCT 46.7 Labs (2/17): K 4, creatinine 1.10, TSH normal, HCT 43 Labs (3/17): LDL 57  PMH: 1. CAD: CABG in 1980s.  NSTEMI in 2009.  Redo CABG in 5/10 => LIMA-LAD, SVG-D, SVG-OM.  NSTEMI 2/17.  LHC (2/17) with patent LIMA-LAD, SVG-PLOM, SVG-D, 99% stenosis in very short left main (new) but only small territory at risk.  LM lesion was likely culprit.  Medical management.  2. Asthma 3. HTN 4. Hyperlipidemia 5. GERD 6. Vertigo 5/16: Suspect BPPV.  Was hospitalized in [place].  MRI head negative for CVA, carotid dopplers without significant stenosis (per his report).  7. Sinus bradycardia 8. Ischemic cardiomyopathy: Echo (2/17) with EF 40-45%, akinesis of the inferolateral and inferior walls, PA systolic pressure 55 mmHg.   SH: Widowed, lives in [place].  Has children.  Quit smoking around 1980.  Occasional  ETOH.   FH: Parents and brother with CAD.   ROS: All systems reviewed and negative except per HPI.    Current Outpatient Prescriptions  Medication Sig Dispense Refill  . albuterol (PROVENTIL HFA) 108 (90 BASE) MCG/ACT inhaler Inhale 1 puff into the lungs as needed for wheezing or shortness of breath.     [name] aspirin 81 MG tablet Take 81 mg by mouth daily.      [name] atorvastatin (LIPITOR) 80 MG tablet Take 1 tablet (80 mg total) by mouth daily at 6 PM. 30 tablet 11  . clopidogrel (PLAVIX) 75 MG tablet Take 1 tablet (75 mg total) by mouth daily. 30 tablet 11  . diphenhydrAMINE (BENADRYL) 25 MG tablet Take 25 mg by mouth at bedtime.    . fluticasone (FLONASE) 50 MCG/ACT nasal spray Place 1 spray into both nostrils at bedtime. DAILY    . isosorbide mononitrate (IMDUR) 30 MG 24 hr tablet Take 2 tablets (60 mg total) by mouth daily. 60 tablet 6  . lisinopril (PRINIVIL,ZESTRIL) 40 MG tablet Take 1 tablet (40 mg total) by mouth daily. (Patient taking differently: Take 40 mg by mouth at bedtime. ) 90 tablet 4  . mometasone (ASMANEX) 220 MCG/INH inhaler Inhale 2 puffs into the lungs daily.    . naproxen sodium (ANAPROX) 220 MG tablet Take 220 mg by mouth daily.      . pantoprazole (PROTONIX) 40 MG tablet  Take 1 tablet (40 mg total) by mouth daily at 6 PM. 30 tablet 5  . carvedilol (COREG) 12.5 MG tablet Take 1 tablet (12.5 mg total) by mouth 2 (two) times daily. 60 tablet 6   No current facility-administered medications for this visit.    BP 132/60 mmHg  Pulse 60  Ht 5\' 8"  (1.727 m)  Wt 165 lb (74.844 kg)  BMI 25.09 kg/m2 General: NAD Neck: No JVD, no thyromegaly or thyroid nodule.  Lungs: Rhonchi that cleared after coughing.  CV: Nondisplaced PMI.  Heart regular S1/S2, no S3/S4, no murmur.  No peripheral edema.  No carotid bruit.  Normal pedal pulses.  Abdomen: Soft, nontender, no hepatosplenomegaly, no distention.  Neurologic: Alert and oriented x 3.  Psych: Normal affect. Extremities: No  clubbing or cyanosis.   Assessment/Plan: 1. CAD: S/p CABG.  NSTEMI 2/17 with culprit 99% LM stenosis managed medically given small territory at risk and very difficult for intervention.  Bypass grafts from CABG #2 were all patent. He still has occasional very mild angina.  - Continue ASA 81 and Plavix.  - Continue atorvastatin 80 daily.  - Increase Imdur to 60 mg daily.  2. HTN: BP controlled.   3. Hyperlipidemia: [facility] lipids [facility] this [facility] at [facility].  4. Ischemic cardiomyopathy: EF 40-45% on 2/17 echo.  Suspect this is from earlier MI rather than his most recent NSTEMI that was relatively small.  - Continue lisinopril 40 mg daily. - Stop atenolol and start Coreg 12.5 mg bid.  5. GERD: Suspect some of his symptoms are related to GERD.  Now on Protonix due to concern for omeprazole interaction with Plavix.  Can increase Protonix to bid as it does not seem as effective as omeprazole to him.    Marca AnconaDalton Oluwaferanmi Reed 06/08/2015

## 2015-12-01 ENCOUNTER — Encounter (HOSPITAL_COMMUNITY): Payer: Medicare Other

## 2015-12-03 ENCOUNTER — Ambulatory Visit (HOSPITAL_COMMUNITY)
Admission: RE | Admit: 2015-12-03 | Discharge: 2015-12-03 | Disposition: A | Discharge status: Home / Self Care | Payer: Medicare Other | Source: Ambulatory Visit | Attending: Cardiology | Admitting: Cardiology

## 2015-12-03 VITALS — BP 144/62 | HR 62 | Wt 160.4 lb

## 2015-12-03 DIAGNOSIS — I252 Old myocardial infarction: Secondary | ICD-10-CM | POA: Diagnosis not present

## 2015-12-03 DIAGNOSIS — M25551 Pain in right hip: Secondary | ICD-10-CM | POA: Insufficient documentation

## 2015-12-03 DIAGNOSIS — I255 Ischemic cardiomyopathy: Secondary | ICD-10-CM | POA: Insufficient documentation

## 2015-12-03 DIAGNOSIS — E785 Hyperlipidemia, unspecified: Secondary | ICD-10-CM | POA: Diagnosis not present

## 2015-12-03 DIAGNOSIS — I10 Essential (primary) hypertension: Secondary | ICD-10-CM | POA: Diagnosis not present

## 2015-12-03 DIAGNOSIS — Z79899 Other long term (current) drug therapy: Secondary | ICD-10-CM | POA: Insufficient documentation

## 2015-12-03 DIAGNOSIS — I25810 Atherosclerosis of coronary artery bypass graft(s) without angina pectoris: Secondary | ICD-10-CM

## 2015-12-03 DIAGNOSIS — Z87891 Personal history of nicotine dependence: Secondary | ICD-10-CM | POA: Diagnosis not present

## 2015-12-03 DIAGNOSIS — R5383 Other fatigue: Secondary | ICD-10-CM | POA: Diagnosis not present

## 2015-12-03 DIAGNOSIS — Z7902 Long term (current) use of antithrombotics/antiplatelets: Secondary | ICD-10-CM | POA: Insufficient documentation

## 2015-12-03 DIAGNOSIS — Z7982 Long term (current) use of aspirin: Secondary | ICD-10-CM | POA: Insufficient documentation

## 2015-12-03 DIAGNOSIS — K219 Gastro-esophageal reflux disease without esophagitis: Secondary | ICD-10-CM | POA: Insufficient documentation

## 2015-12-03 DIAGNOSIS — Z951 Presence of aortocoronary bypass graft: Secondary | ICD-10-CM | POA: Diagnosis not present

## 2015-12-03 DIAGNOSIS — J45909 Unspecified asthma, uncomplicated: Secondary | ICD-10-CM | POA: Insufficient documentation

## 2015-12-03 DIAGNOSIS — Z7951 Long term (current) use of inhaled steroids: Secondary | ICD-10-CM | POA: Diagnosis not present

## 2015-12-03 DIAGNOSIS — I2510 Atherosclerotic heart disease of native coronary artery without angina pectoris: Secondary | ICD-10-CM | POA: Diagnosis not present

## 2015-12-03 LAB — BASIC METABOLIC PANEL
ANION GAP: 5 (ref 5–15)
BUN: 17 mg/dL (ref 6–20)
CALCIUM: 9.9 mg/dL (ref 8.9–10.3)
CO2: 28 mmol/L (ref 22–32)
CREATININE: 0.92 mg/dL (ref 0.61–1.24)
Chloride: 105 mmol/L (ref 101–111)
Glucose, Bld: 122 mg/dL — ABNORMAL HIGH (ref 65–99)
Potassium: 5 mmol/L (ref 3.5–5.1)
SODIUM: 138 mmol/L (ref 135–145)

## 2015-12-03 LAB — BRAIN NATRIURETIC PEPTIDE: B Natriuretic Peptide: 173.7 pg/mL — ABNORMAL HIGH (ref 0.0–100.0)

## 2015-12-03 NOTE — Patient Instructions (Signed)
Labs today  Your physician recommends that you schedule a follow-up appointment in: 3 months with echocardiogram  

## 2015-12-03 NOTE — Progress Notes (Signed)
Advanced Heart Failure Medication Review by a Pharmacist  Does the patient  feel that his/her medications are working for him/her?  yes  Has the patient been experiencing any side effects to the medications prescribed?  no  Does the patient measure his/her own blood pressure or blood glucose at home?  no   Does the patient have any problems obtaining medications due to transportation or finances?   no  Understanding of regimen: good Understanding of indications: good Potential of compliance: good Patient understands to avoid NSAIDs. Patient understands to avoid decongestants.  Issues to address at subsequent visits: None   Pharmacist comments:  Mr. Reczek is a pleasant 82 yo M presenting with a Sherrie Mustachecurrent medication list. He reports good compliance with his regimen. I did notice that he was taking naproxen daily so discussed the CV risks associated with this class of medication and he agreed to try APAP instead. No other medication-related questions or concerns for me at this time.   Tyler DeisErika K. Bonnye FavaNicolsen, PharmD, BCPS, CPP Clinical Pharmacist Pager: [phone] Phone: [phone] 12/03/2015 10:50 AM      Time with patient: 8 minutes Preparation and documentation time: 2 minutes Total time: 10 minutes

## 2015-12-04 NOTE — Progress Notes (Signed)
Patient ID: Ricardo Reed, male   DOB: 02/14/1932, 82 y.o.   MRN: [id number] Cardiology: Dr. Shirlee LatchMcLean  82 yo with history of CAD s/p CABG and redo CABG presents for cardiology followup.  He had his redo CABG in 5/10.  In 2/17, he had NSTEMI with LHC showing all the bypass grafts from CABG #2 patent, but he had culprit new 99% left main stenosis.  The LM was very short.  The territory at risk was very small due to patent bypass grafts.  PCI would have been extremely difficult, so he was managed medically.  Echo in 2/17 showed EF 40-45% with inferior/inferolateral akinesis (likely from a prior MI).  He was started on Plavix and Imdur.   Patient presents for followup today.  He is not as active as in the past.  Seems to be limited mainly by right hip pain.  However, he also feels like he is more fatigued than in the past. He "wears out" easier when doing yardwork or going on a walk.  No exertional chest pain, no dyspnea. Still has some GERD-like sensations, does not think Protonix works as well for GERD as omeprazole did.  Weight is down 5 lbs.    Labs (6/14): LDL 51, HDL 35, K 4.1, creatinine 0.8, HCT 48 Labs (6/15): LDL 43, HDL 36.5, K 4.2, creatinine 0.9, HCT 46.7 Labs (2/17): K 4, creatinine 1.10, TSH normal, HCT 43 Labs (3/17): LDL 57  PMH: 1. CAD: CABG in 1980s.  NSTEMI in 2009.  Redo CABG in 5/10 => LIMA-LAD, SVG-D, SVG-OM.  NSTEMI 2/17.  LHC (2/17) with patent LIMA-LAD, SVG-PLOM, SVG-D, occlusion of short left main (new) but only small territory lost (small OM1).  LM lesion was likely culprit for MI.  Medical management.  2. Asthma 3. HTN 4. Hyperlipidemia 5. GERD 6. Vertigo 5/16: Suspect BPPV.  [place] in [place].  MRI head negative for CVA, carotid dopplers without significant stenosis (per his report).  7. Sinus bradycardia 8. Ischemic cardiomyopathy: Echo (2/17) with EF 40-45%, akinesis of the inferolateral and inferior walls, PA systolic pressure 55 mmHg.   SH: Widowed, lives  in [place].  Has children.  Quit smoking around 1980.  Occasional ETOH.   FH: Parents and brother with CAD.   ROS: All systems reviewed and negative except per HPI.    Current Outpatient Prescriptions  Medication Sig Dispense Refill  . albuterol (PROVENTIL HFA) 108 (90 BASE) MCG/ACT inhaler Inhale 1 puff into the lungs as needed for wheezing or shortness of breath.     [name]. aspirin 81 MG tablet Take 81 mg by mouth daily.      [name]. atorvastatin (LIPITOR) 80 MG tablet Take 1 tablet (80 mg total) by mouth daily at 6 PM. 30 tablet 11  . carvedilol (COREG) 12.5 MG tablet Take 1 tablet (12.5 mg total) by mouth 2 (two) times daily. 60 tablet 6  . clopidogrel (PLAVIX) 75 MG tablet Take 1 tablet (75 mg total) by mouth daily. 30 tablet 11  . diphenhydrAMINE (BENADRYL) 25 MG tablet Take 25 mg by mouth at bedtime.    . fluticasone (FLONASE) 50 MCG/ACT nasal spray Place 1 spray into both nostrils at bedtime. DAILY    . guaiFENesin (MUCINEX) 600 MG 12 hr tablet Take 600 mg by mouth 2 (two) times daily as needed for cough.    . isosorbide mononitrate (IMDUR) 30 MG 24 hr tablet Take 2 tablets (60 mg total) by mouth daily. 60 tablet 6  . lisinopril (PRINIVIL,ZESTRIL) 40  MG tablet Take 40 mg by mouth every evening.    . mometasone (ASMANEX) 220 MCG/INH inhaler Inhale 2 puffs into the lungs daily.    . naproxen sodium (ANAPROX) 220 MG tablet Take 220 mg by mouth daily.      . pantoprazole (PROTONIX) 40 MG tablet Take 1 tablet (40 mg total) by mouth daily at 6 PM. 30 tablet 5   No current facility-administered medications for this encounter.     BP (!) 144/62 (BP Location: Right Arm, Patient Position: Sitting, Cuff Size: Normal)   Pulse 62   Wt 160 lb 6.4 oz (72.8 kg)   SpO2 98%   BMI 24.39 kg/m  General: NAD Neck: No JVD, no thyromegaly or thyroid nodule.  Lungs: CTAB.   CV: Nondisplaced PMI.  Heart regular S1/S2, no S3/S4, no murmur.  No peripheral edema.  No carotid bruit.  Normal pedal pulses.   Abdomen: Soft, nontender, no hepatosplenomegaly, no distention.  Neurologic: Alert and oriented x 3.  Psych: Normal affect. Extremities: No clubbing or cyanosis.   Assessment/Plan: 1. CAD: S/p CABG.  NSTEMI 2/17 with culprit total occlusion of left main managed medically given small territory lost (small OM1) and very difficult for intervention.  Bypass grafts from CABG #2 were all patent. No exertional chest pain on Imdur and Coreg, however he feels generally more fatigue with exertion.  It is possible that this is due to decreased exercise in the setting of hip pain that has been significantly limiting.   - Continue ASA 81 and Plavix.  - Continue atorvastatin 80 daily.  - Continue Imdur, Coreg.  - We reviewed his angiogram from earlier this year today.  2. HTN: BP generally controlled.   3. Hyperlipidemia: Check lipids today.   4. Ischemic cardiomyopathy: EF 40-45% on 2/17 echo.  Suspect this is from earlier MI rather than his most recent NSTEMI that was relatively small.  He is not volume overloaded on exam and does not have dyspnea (does have fatigue).  - Continue lisinopril 40 mg daily. - Continue Coreg 12.5 mg bid.  - Repeat echo at followup in 3 months.  - BMET/BNP today.  5. GERD: Post-prandial epigastric symptoms.  Protonix is not as effective as omeprazole.  Would avoid omeprazole with Plavix use. Can add ranitidine to see if this helps symptoms.    Ricardo Reed 12/04/2015

## 2016-01-06 ENCOUNTER — Other Ambulatory Visit (HOSPITAL_COMMUNITY): Payer: Self-pay | Admitting: *Deleted

## 2016-01-06 MED ORDER — CARVEDILOL 12.5 MG PO TABS: 12.5000 mg | ORAL_TABLET | Freq: Two times a day (BID) | ORAL | 6 refills | Status: DC

## 2016-02-14 ENCOUNTER — Telehealth: Payer: Self-pay | Admitting: Cardiology

## 2016-02-14 NOTE — Telephone Encounter (Signed)
New message       Talk to the nurse about pt stopping plavix for an upcoming procedure.  I told the pt he needed [facility] to call us but he want to talk to the nurse.

## 2016-02-14 NOTE — Telephone Encounter (Signed)
Follow Up:    He wanted you to know [facility] will be faxing you all the information

## 2016-02-14 NOTE — Telephone Encounter (Signed)
Pt states he is scheduled to have an injection in his back next week. Pt states he has been asked to hold his Plavix prior to injection  and is asking for an order to do that.  Pt advised our office requests provider doing procedure contact Dr Shirlee LatchMcLean with request to hold medications.  Pt verbalized understanding

## 2016-02-15 ENCOUNTER — Other Ambulatory Visit: Payer: Self-pay | Admitting: Surgery

## 2016-02-15 DIAGNOSIS — M545 Low back pain, unspecified: Secondary | ICD-10-CM

## 2016-02-15 NOTE — Telephone Encounter (Signed)
May hold plavix then restart.

## 2016-02-15 NOTE — Telephone Encounter (Signed)
Follow Up:    He wants to know if you have faxed his clearance to [facility]? Please let him know if you have.

## 2016-02-15 NOTE — Telephone Encounter (Signed)
Pt advised by voice mail Dr Shirlee LatchMcLean said okay to stop Plavix for 5 days, will fax to [facility].

## 2016-02-15 NOTE — Telephone Encounter (Signed)
Received fax request from [facility]:  Request for surgical clearance:  1. What type of surgery is being performed? Lumbar ESI  2. When is this surgery scheduled? unknown  3. Are there any medications that need to be held prior to surgery and how long? Stop Plavix for 5 days  4. Name of physician performing surgery? [facility].  5. What is your office phone and fax number? [phone], fax [phone]   I will forward to Dr Shirlee LatchMcLean for review.

## 2016-02-16 NOTE — Telephone Encounter (Signed)
Faxed to [facility]

## 2016-02-21 ENCOUNTER — Ambulatory Visit
Admission: RE | Admit: 2016-02-21 | Discharge: 2016-02-21 | Disposition: A | Discharge status: Home / Self Care | Payer: Medicare Other | Source: Ambulatory Visit | Attending: Surgery | Admitting: Surgery

## 2016-02-21 DIAGNOSIS — M545 Low back pain, unspecified: Secondary | ICD-10-CM

## 2016-02-21 MED ORDER — IOPAMIDOL (ISOVUE-M 200) INJECTION 41%
1.0000 mL | Freq: Once | INTRAMUSCULAR | Status: AC
  Administered 2016-02-21: 1 mL via EPIDURAL

## 2016-02-21 MED ORDER — METHYLPREDNISOLONE ACETATE 40 MG/ML INJ SUSP (RADIOLOG
120.0000 mg | Freq: Once | INTRAMUSCULAR | Status: AC
  Administered 2016-02-21: 120 mg via EPIDURAL

## 2016-02-21 NOTE — Discharge Instructions (Signed)

## 2016-03-06 ENCOUNTER — Encounter (HOSPITAL_COMMUNITY): Payer: Self-pay

## 2016-03-06 ENCOUNTER — Ambulatory Visit (HOSPITAL_COMMUNITY)
Admission: RE | Admit: 2016-03-06 | Discharge: 2016-03-06 | Disposition: A | Discharge status: Home / Self Care | Payer: Medicare Other | Source: Ambulatory Visit | Attending: Cardiology | Admitting: Cardiology

## 2016-03-06 ENCOUNTER — Other Ambulatory Visit (HOSPITAL_COMMUNITY): Payer: Self-pay | Admitting: *Deleted

## 2016-03-06 ENCOUNTER — Ambulatory Visit (HOSPITAL_BASED_OUTPATIENT_CLINIC_OR_DEPARTMENT_OTHER)
Admission: RE | Admit: 2016-03-06 | Discharge: 2016-03-06 | Disposition: A | Discharge status: Home / Self Care | Payer: Medicare Other | Source: Ambulatory Visit | Attending: Cardiology | Admitting: Cardiology

## 2016-03-06 VITALS — BP 121/65 | HR 66 | Resp 20 | Wt 154.8 lb

## 2016-03-06 DIAGNOSIS — I071 Rheumatic tricuspid insufficiency: Secondary | ICD-10-CM | POA: Diagnosis not present

## 2016-03-06 DIAGNOSIS — I255 Ischemic cardiomyopathy: Secondary | ICD-10-CM | POA: Diagnosis not present

## 2016-03-06 DIAGNOSIS — R001 Bradycardia, unspecified: Secondary | ICD-10-CM | POA: Insufficient documentation

## 2016-03-06 DIAGNOSIS — I252 Old myocardial infarction: Secondary | ICD-10-CM | POA: Diagnosis not present

## 2016-03-06 DIAGNOSIS — E785 Hyperlipidemia, unspecified: Secondary | ICD-10-CM | POA: Insufficient documentation

## 2016-03-06 DIAGNOSIS — Z7982 Long term (current) use of aspirin: Secondary | ICD-10-CM | POA: Diagnosis not present

## 2016-03-06 DIAGNOSIS — I509 Heart failure, unspecified: Secondary | ICD-10-CM | POA: Diagnosis not present

## 2016-03-06 DIAGNOSIS — I25810 Atherosclerosis of coronary artery bypass graft(s) without angina pectoris: Secondary | ICD-10-CM | POA: Diagnosis not present

## 2016-03-06 DIAGNOSIS — K219 Gastro-esophageal reflux disease without esophagitis: Secondary | ICD-10-CM | POA: Insufficient documentation

## 2016-03-06 DIAGNOSIS — J45909 Unspecified asthma, uncomplicated: Secondary | ICD-10-CM | POA: Diagnosis not present

## 2016-03-06 DIAGNOSIS — Z951 Presence of aortocoronary bypass graft: Secondary | ICD-10-CM | POA: Insufficient documentation

## 2016-03-06 DIAGNOSIS — I110 Hypertensive heart disease with heart failure: Secondary | ICD-10-CM | POA: Diagnosis not present

## 2016-03-06 DIAGNOSIS — I340 Nonrheumatic mitral (valve) insufficiency: Secondary | ICD-10-CM | POA: Diagnosis not present

## 2016-03-06 DIAGNOSIS — I2510 Atherosclerotic heart disease of native coronary artery without angina pectoris: Secondary | ICD-10-CM | POA: Diagnosis not present

## 2016-03-06 LAB — BASIC METABOLIC PANEL
ANION GAP: 8 (ref 5–15)
BUN: 15 mg/dL (ref 6–20)
CO2: 29 mmol/L (ref 22–32)
Calcium: 9.4 mg/dL (ref 8.9–10.3)
Chloride: 98 mmol/L — ABNORMAL LOW (ref 101–111)
Creatinine, Ser: 0.93 mg/dL (ref 0.61–1.24)
Glucose, Bld: 115 mg/dL — ABNORMAL HIGH (ref 65–99)
POTASSIUM: 4.8 mmol/L (ref 3.5–5.1)
SODIUM: 135 mmol/L (ref 135–145)

## 2016-03-06 LAB — BRAIN NATRIURETIC PEPTIDE: B NATRIURETIC PEPTIDE 5: 184 pg/mL — AB (ref 0.0–100.0)

## 2016-03-06 MED ORDER — SACUBITRIL-VALSARTAN 24-26 MG PO TABS: 1.0000 | ORAL_TABLET | Freq: Two times a day (BID) | ORAL | 3 refills | Status: DC

## 2016-03-06 NOTE — Progress Notes (Signed)
  Echocardiogram 2D Echocardiogram has been performed.  Leta JunglingCooper, Donovyn Guidice M 03/06/2016, 10:45 AM

## 2016-03-06 NOTE — Progress Notes (Signed)
Patient ID: Ricardo Reed, male   DOB: 07/19/1931, 82 y.o.   MRN: [id number] Cardiology: Dr. Shirlee LatchMcLean  82 yo with history of CAD s/p CABG and redo CABG presents for cardiology followup.  He had his redo CABG in 5/10.  In 2/17, he had NSTEMI with LHC showing all the bypass grafts from CABG #2 patent, but he had culprit new 99% left main stenosis.  The LM was very short.  The territory at risk was very small due to patent bypass grafts.  PCI would have been extremely difficult, so he was managed medically.  Echo in 2/17 showed EF 40-45% with inferior/inferolateral akinesis (likely from a prior MI).  He was started on Plavix and Imdur.  Echo 12/17 (reviewed today) showed EF 35-40%, basal to mid inferior/inferolateral akinesis, mild to moderate MR, mildly decreased RV systolic function.   Patient presents for followup today.  Overall, feels like he is doing better.  He had an epidural injection with decreased low back pain, able to exercise more.  He is walking up to a mile without dyspnea. No chest pain.  He backed off on Imdur to 30 mg daily and is doing fine at the lower dose.  He is not sure if he is taking Coreg once a day or twice a day.  Weight is down 6 lbs.    Labs (6/14): LDL 51, HDL 35, K 4.1, creatinine 0.8, HCT 48 Labs (6/15): LDL 43, HDL 36.5, K 4.2, creatinine 0.9, HCT 46.7 Labs (2/17): K 4, creatinine 1.10, TSH normal, HCT 43 Labs (3/17): LDL 57 Labs (9/17): K 5, creatinine 0.92, BNP 174  PMH: 1. CAD: CABG in 1980s.  NSTEMI in 2009.  Redo CABG in 5/10 => LIMA-LAD, SVG-D, SVG-OM.  NSTEMI 2/17.  LHC (2/17) with patent LIMA-LAD, SVG-PLOM, SVG-D, occlusion of short left main (new) but only small territory lost (small OM1).  LM lesion was likely culprit for MI.  Medical management.  2. Asthma 3. HTN 4. Hyperlipidemia 5. GERD 6. Vertigo 5/16: Suspect BPPV.  [place] in [place].  MRI head negative for CVA, carotid dopplers without significant stenosis (per his report).  7. Sinus  bradycardia 8. Ischemic cardiomyopathy: Echo (2/17) with EF 40-45%, akinesis of the inferolateral and inferior walls, PA systolic pressure 55 mmHg.  - Echo 12/17 showed EF 35-40%, basal to mid inferior/inferolateral akinesis, mild to moderate MR, mildly decreased RV systolic function.   SH: Widowed, lives in [place].  Has children.  Quit smoking around 1980.  Occasional ETOH.   FH: Parents and brother with CAD.   ROS: All systems reviewed and negative except per HPI.    Current Outpatient Prescriptions  Medication Sig Dispense Refill  . albuterol (PROVENTIL HFA) 108 (90 BASE) MCG/ACT inhaler Inhale 1 puff into the lungs as needed for wheezing or shortness of breath.     [name]. aspirin 81 MG tablet Take 81 mg by mouth daily.      [name]. atorvastatin (LIPITOR) 80 MG tablet Take 1 tablet (80 mg total) by mouth daily at 6 PM. 30 tablet 11  . carvedilol (COREG) 12.5 MG tablet Take 1 tablet (12.5 mg total) by mouth 2 (two) times daily. 60 tablet 6  . clopidogrel (PLAVIX) 75 MG tablet Take 1 tablet (75 mg total) by mouth daily. 30 tablet 11  . diphenhydrAMINE (BENADRYL) 25 MG tablet Take 25 mg by mouth at bedtime.    . fluticasone (FLONASE) 50 MCG/ACT nasal spray Place 1 spray into both nostrils at bedtime. DAILY    .  guaiFENesin (MUCINEX) 600 MG 12 hr tablet Take 600 mg by mouth 2 (two) times daily as needed for cough.    . isosorbide mononitrate (IMDUR) 30 MG 24 hr tablet Take 2 tablets (60 mg total) by mouth daily. 60 tablet 6  . mometasone (ASMANEX) 220 MCG/INH inhaler Inhale 2 puffs into the lungs daily.    . naproxen sodium (ANAPROX) 220 MG tablet Take 220 mg by mouth daily.      . pantoprazole (PROTONIX) 40 MG tablet Take 1 tablet (40 mg total) by mouth daily at 6 PM. 30 tablet 5  . [START ON 03/08/2016] sacubitril-valsartan (ENTRESTO) 24-26 MG Take 1 tablet by mouth 2 (two) times daily. 60 tablet 3   No current facility-administered medications for this encounter.     BP 121/65 (BP Location:  Left Arm, Patient Position: Sitting, Cuff Size: Normal)   Pulse 66   Resp 20   Wt 154 lb 12 oz (70.2 kg)   SpO2 98%   BMI 23.53 kg/m  General: NAD Neck: No JVD, no thyromegaly or thyroid nodule.  Lungs: CTAB.   CV: Nondisplaced PMI.  Heart regular S1/S2, no S3/S4, no murmur.  No peripheral edema.  No carotid bruit.  Normal pedal pulses.  Abdomen: Soft, nontender, no hepatosplenomegaly, no distention.  Neurologic: Alert and oriented x 3.  Psych: Normal affect. Extremities: No clubbing or cyanosis.   Assessment/Plan: 1. CAD: S/p CABG.  NSTEMI 2/17 with culprit total occlusion of left main managed medically given small territory lost (small OM1) and very difficult for intervention.  Bypass grafts from CABG #2 were all patent. No exertional chest pain on Imdur and Coreg at this point and he has been able to decrease Imdur dose.   - Continue ASA 81 and Plavix.  - Continue atorvastatin 80 daily.  - Continue Imdur 30 mg daily, Coreg.  2. HTN: BP generally controlled.   3. Hyperlipidemia: Check lipids today.   4. Ischemic cardiomyopathy: EF 35-40% on echo today, I reviewed.  Suspect this is from earlier MI rather than his most recent NSTEMI that was relatively small (has inferior and inferolateral akinesis).  He is not volume overloaded on exam, NYHA class II symptoms. - Continue Coreg, make sure to take bid.   - Stop lisinopril, in 36 hours start Entresto 24/26 bid with BMET/BNP today and BMET in 10 days.  He will call in 2 wks with BP readings.  - Will add low dose spironolactone next.   5. GERD: Improved.   Followup in 4 months.     Marca AnconaDalton Yarimar Lavis 03/06/2016

## 2016-03-06 NOTE — Patient Instructions (Signed)
STOP Lisinopril.  START Entresto 24/26mg  tablet: Take 1 tablet twice daily (once every 12 hours). Start on Wednesday 03/08/2016. Use 30 day free card. Call Elizabeth PalauErika Nicolsen CHF Clinical Pharmacist for financial assistance if needed.  Routine lab work today. Will notify you of abnormal results, otherwise no news is good news!  Have labs drawn at preferred lab site in 10-14 days from today's date. Turn in Rx paper order.  Check BP readings every day. Call [phone]) or fax [phone]) readings after 1-2 weeks.  Follow up 4 months with Dr. Shirlee LatchMcLean.  Merry Christmas and Happy New Year!  Do the following things EVERYDAY: 1) Weigh yourself in the morning before breakfast. Write it down and keep it in a log. 2) Take your medicines as prescribed 3) Eat low salt foods-Limit salt (sodium) to 2000 mg per day.  4) Stay as active as you can everyday 5) Limit all fluids for the day to less than 2 liters

## 2016-03-09 ENCOUNTER — Telehealth (HOSPITAL_COMMUNITY): Payer: Self-pay

## 2016-03-09 ENCOUNTER — Telehealth (HOSPITAL_COMMUNITY): Payer: Self-pay | Admitting: Pharmacist

## 2016-03-09 NOTE — Telephone Encounter (Signed)
Patient calling CHF clinic to ask for assistance with PA for the [facility] to get Entresto. Will forward to AssurantErika Nicolsen as he states he recently spoke with her about this issue.  Ave FilterBradley, Ricardo Genevea, RN

## 2016-03-09 NOTE — Telephone Encounter (Signed)
Ricardo Reed called stating that [facility] as well as a letter of medical necessity for his Sherryll Burgerntresto faxed to [phone] Ricardo Reed(Attn Ricardo Reed). He also stated that his BP this morning was 89/45 mmHg and he felt tired but about an hour later it came back up to the 100/60s and he felt fine. I have asked him to check again tomorrow morning and contact us if his BP remains low.   Tyler DeisErika K. Bonnye FavaNicolsen, PharmD, BCPS, CPP Clinical Pharmacist Pager: [phone] Phone: [phone] 03/09/2016 4:53 PM

## 2016-03-09 NOTE — Telephone Encounter (Signed)
Ok, can we get the letter together?

## 2016-03-10 ENCOUNTER — Encounter (HOSPITAL_COMMUNITY): Payer: Self-pay | Admitting: Pharmacist

## 2016-03-10 MED ORDER — SACUBITRIL-VALSARTAN 24-26 MG PO TABS: 1.0000 | ORAL_TABLET | Freq: Two times a day (BID) | ORAL | 11 refills | Status: DC

## 2016-03-10 NOTE — Addendum Note (Signed)
Addended by: Noralee SpaceSCHUB, Darbi Chandran M on: 03/10/2016 10:08 AM   Modules accepted: Orders

## 2016-03-10 NOTE — Telephone Encounter (Signed)
[facility] and [facility] to [facility]. Mr. Sherrie MustacheFisher notified and he also stated that his BP this morning was much better with SBP in the 100s and he has felt well.   Tyler DeisErika K. Bonnye FavaNicolsen, PharmD, BCPS, CPP Clinical Pharmacist Pager: [phone] Phone: [phone] 03/10/2016 10:16 AM

## 2016-03-23 ENCOUNTER — Other Ambulatory Visit (HOSPITAL_COMMUNITY): Payer: Self-pay | Admitting: Cardiology

## 2016-03-24 ENCOUNTER — Telehealth (HOSPITAL_COMMUNITY): Payer: Self-pay | Admitting: *Deleted

## 2016-03-24 NOTE — Telephone Encounter (Signed)
Patient was advised to call us with BP and HR readings since we started him on Entresto.   Dates: December 13th-29th BP Ranges from Systolic 116-134, Diastolic 47-65 HR ranges 53-62

## 2016-03-29 ENCOUNTER — Telehealth (HOSPITAL_COMMUNITY): Payer: Self-pay | Admitting: *Deleted

## 2016-03-29 NOTE — Telephone Encounter (Signed)
Patient called saying the [facility] is needing a written prescription and recent medical history to be approved for Entresto.  Cicero DuckErika, PharmD faxed prescription with letter on December 15th, but patient is still have trouble and not sure they ever received the information.    He asked me to fax again a written prescription and his last office visit summary to [phone] ATTN: Margie.   I faxed requested documents and told him to follow up tomorrow with the VA to make sure they received them. He will call us back if there is still a problem.

## 2016-04-11 ENCOUNTER — Telehealth (HOSPITAL_COMMUNITY): Payer: Self-pay | Admitting: Pharmacist

## 2016-04-11 NOTE — Telephone Encounter (Signed)
Mr. Ricardo Reed has not heard back from [facility] in regards to his Sherryll Burgerntresto and he will run out before his next appointment with them on 05/10/16. I will provide him with samples and the letter of medical necessity that I faxed to the VA last month.   Tyler DeisErika K. Bonnye FavaNicolsen, PharmD, BCPS, CPP Clinical Pharmacist Pager: [phone] Phone: [phone] 04/11/2016 4:28 PM

## 2016-04-18 ENCOUNTER — Telehealth (HOSPITAL_COMMUNITY): Payer: Self-pay | Admitting: *Deleted

## 2016-04-18 NOTE — Telephone Encounter (Signed)
Patient was advised to call us with BP and HR readings since we started him on Entresto.   Dates: December 30-April 17, 2016 BP Ranges from Systolic 105-125, Diastolic 51-78 HR ranges 53-64

## 2016-05-30 ENCOUNTER — Telehealth (HOSPITAL_COMMUNITY): Payer: Self-pay | Admitting: Pharmacist

## 2016-05-30 NOTE — Telephone Encounter (Signed)
Mr. Ricardo Reed called stating that the [facility] needed [facility] most recent [facility] results faxed. These were sent attention to Dr. Allena KatzPatel.   Tyler DeisErika K. Bonnye FavaNicolsen, PharmD, BCPS, CPP Clinical Pharmacist Pager: [phone] Phone: [phone] 05/30/2016 1:40 PM

## 2016-05-31 ENCOUNTER — Telehealth (HOSPITAL_COMMUNITY): Payer: Self-pay | Admitting: Pharmacist

## 2016-05-31 NOTE — Telephone Encounter (Signed)
Mr. Sherrie MustacheFisher called to notify us that his Sherryll Burgerntresto was [facility] (Dr. Allena KatzPatel).   Tyler DeisErika K. Bonnye FavaNicolsen, PharmD, BCPS, CPP Clinical Pharmacist Pager: [phone] Phone: [phone] 05/31/2016 11:41 AM

## 2016-08-31 ENCOUNTER — Telehealth (HOSPITAL_COMMUNITY): Payer: Self-pay | Admitting: *Deleted

## 2016-08-31 NOTE — Telephone Encounter (Signed)
HR is a bit low, try cutting carvedilol down to 6.25 mg bid, let us know if this helps.

## 2016-08-31 NOTE — Telephone Encounter (Signed)
Pt's PCP called to inform Dr Shirlee LatchMcLean that pt has been having a lot of dizziness.  She states she has seen pt a couple of times for this in past 2 weeks.  She states pt's BP 112/62 HR 54 today, no orthostatics were done.  She did do a carotid u/s which showed 0-40% stenosis bilat and has tried pt on Meclizine but it does not seem to have helped at all.  Pt is on carvedilol, imdure and entresto.  She would like Dr Shirlee LatchMcLean to be aware and see if he has any recommendations.  Will forward to him for his review.

## 2016-09-04 ENCOUNTER — Other Ambulatory Visit (HOSPITAL_COMMUNITY): Payer: Self-pay | Admitting: *Deleted

## 2016-09-04 MED ORDER — CARVEDILOL 12.5 MG PO TABS: 6.2500 mg | ORAL_TABLET | Freq: Two times a day (BID) | ORAL | 6 refills | Status: DC

## 2016-09-04 NOTE — Telephone Encounter (Signed)
Spoke with PCP and gave med change.

## 2016-09-07 ENCOUNTER — Telehealth (HOSPITAL_COMMUNITY): Payer: Self-pay | Admitting: Pharmacist

## 2016-09-07 NOTE — Telephone Encounter (Signed)
Patient called stating that he needs a letter addressed to [place] at [facility] (fax #[phone]) to ok the use of Versed for his upcoming cataract surgery on Tuesday 09/12/16.    Tyler DeisErika K. Bonnye FavaNicolsen, PharmD, BCPS, CPP Clinical Pharmacist Pager: [phone] Phone: [phone] 09/07/2016 4:21 PM

## 2016-09-08 NOTE — Telephone Encounter (Signed)
Will forward to Dr. Shirlee LatchMcLean for him to review.  [facility].

## 2016-09-10 NOTE — Telephone Encounter (Signed)
Please send the requested letter.

## 2016-09-11 ENCOUNTER — Encounter (HOSPITAL_COMMUNITY): Payer: Self-pay | Admitting: *Deleted

## 2016-09-11 ENCOUNTER — Telehealth (HOSPITAL_COMMUNITY): Payer: Self-pay | Admitting: *Deleted

## 2016-09-11 NOTE — Telephone Encounter (Signed)
Letter Faxed to [facility] @ [phone] for clearance to use Versed if needed for his upcoming cataract surgery Tuesday 09/12/16.

## 2016-09-11 NOTE — Telephone Encounter (Signed)
Clearance faxed today 

## 2017-05-01 MED ORDER — LISINOPRIL 2.5 MG PO TABS
2.50 | ORAL_TABLET | ORAL | Status: DC
Start: 2017-05-02 — End: 2017-05-01

## 2017-05-01 MED ORDER — PANTOPRAZOLE SODIUM 40 MG PO TBEC
40.00 | DELAYED_RELEASE_TABLET | ORAL | Status: DC
Start: 2017-05-02 — End: 2017-05-01

## 2017-05-01 MED ORDER — DEXTROSE 50 % IV SOLN
25.00 | INTRAVENOUS | Status: DC
Start: ? — End: 2017-05-01

## 2017-05-01 MED ORDER — GENERIC EXTERNAL MEDICATION
1.50 | Status: DC
Start: 2017-05-01 — End: 2017-05-01

## 2017-05-01 MED ORDER — SODIUM CHLORIDE 0.9 % IJ SOLN
10.00 | INTRAMUSCULAR | Status: DC
Start: ? — End: 2017-05-01

## 2017-05-01 MED ORDER — HEPARIN SODIUM (PORCINE) 5000 UNIT/ML IJ SOLN
5000.00 | INTRAMUSCULAR | Status: DC
Start: 2017-05-01 — End: 2017-05-01

## 2017-05-01 MED ORDER — METOPROLOL TARTRATE 5 MG/5ML IV SOLN
5.00 | INTRAVENOUS | Status: DC
Start: ? — End: 2017-05-01

## 2017-05-01 MED ORDER — ATORVASTATIN CALCIUM 10 MG PO TABS
10.00 | ORAL_TABLET | ORAL | Status: DC
Start: 2017-05-01 — End: 2017-05-01

## 2017-05-01 MED ORDER — HYDRALAZINE HCL 20 MG/ML IJ SOLN
10.00 | INTRAMUSCULAR | Status: DC
Start: ? — End: 2017-05-01

## 2017-05-01 MED ORDER — SODIUM CHLORIDE 0.9 % IV SOLN
50.00 | INTRAVENOUS | Status: DC
Start: ? — End: 2017-05-01

## 2017-05-01 MED ORDER — TICAGRELOR 90 MG PO TABS
90.00 | ORAL_TABLET | ORAL | Status: DC
Start: 2017-05-01 — End: 2017-05-01

## 2017-05-01 MED ORDER — CARVEDILOL 3.125 MG PO TABS
3.13 | ORAL_TABLET | ORAL | Status: DC
Start: 2017-05-02 — End: 2017-05-01

## 2017-05-01 MED ORDER — LIDOCAINE 5 % EX PTCH
1.00 | MEDICATED_PATCH | CUTANEOUS | Status: DC
Start: 2017-05-02 — End: 2017-05-01

## 2017-05-01 MED ORDER — TAMSULOSIN HCL 0.4 MG PO CAPS
0.40 | ORAL_CAPSULE | ORAL | Status: DC
Start: 2017-05-02 — End: 2017-05-01

## 2017-05-01 MED ORDER — POLYVINYL ALCOHOL 1.4 % OP SOLN
1.00 | OPHTHALMIC | Status: DC
Start: ? — End: 2017-05-01

## 2017-05-01 MED ORDER — ASPIRIN 81 MG PO CHEW
81.00 | CHEWABLE_TABLET | ORAL | Status: DC
Start: 2017-05-02 — End: 2017-05-01

## 2017-05-01 MED ORDER — INSULIN LISPRO 100 UNIT/ML ~~LOC~~ SOLN
2.00 | SUBCUTANEOUS | Status: DC
Start: ? — End: 2017-05-01

## 2017-05-01 MED ORDER — IPRATROPIUM-ALBUTEROL 0.5-2.5 (3) MG/3ML IN SOLN
3.00 | RESPIRATORY_TRACT | Status: DC
Start: ? — End: 2017-05-01

## 2017-05-01 MED ORDER — ONDANSETRON HCL 4 MG/2ML IJ SOLN
4.00 | INTRAMUSCULAR | Status: DC
Start: ? — End: 2017-05-01

## 2017-05-09 ENCOUNTER — Ambulatory Visit (HOSPITAL_COMMUNITY)
Admission: RE | Admit: 2017-05-09 | Discharge: 2017-05-09 | Disposition: A | Discharge status: Home / Self Care | Payer: Medicare Other | Source: Ambulatory Visit | Attending: Cardiology | Admitting: Cardiology

## 2017-05-09 VITALS — BP 168/99 | HR 68 | Wt 161.0 lb

## 2017-05-09 DIAGNOSIS — Z8674 Personal history of sudden cardiac arrest: Secondary | ICD-10-CM | POA: Diagnosis not present

## 2017-05-09 DIAGNOSIS — I2582 Chronic total occlusion of coronary artery: Secondary | ICD-10-CM | POA: Insufficient documentation

## 2017-05-09 DIAGNOSIS — J45909 Unspecified asthma, uncomplicated: Secondary | ICD-10-CM | POA: Diagnosis not present

## 2017-05-09 DIAGNOSIS — I110 Hypertensive heart disease with heart failure: Secondary | ICD-10-CM | POA: Diagnosis not present

## 2017-05-09 DIAGNOSIS — I25810 Atherosclerosis of coronary artery bypass graft(s) without angina pectoris: Secondary | ICD-10-CM | POA: Diagnosis not present

## 2017-05-09 DIAGNOSIS — I252 Old myocardial infarction: Secondary | ICD-10-CM | POA: Diagnosis not present

## 2017-05-09 DIAGNOSIS — Z87891 Personal history of nicotine dependence: Secondary | ICD-10-CM | POA: Diagnosis not present

## 2017-05-09 DIAGNOSIS — I509 Heart failure, unspecified: Secondary | ICD-10-CM | POA: Diagnosis not present

## 2017-05-09 DIAGNOSIS — E785 Hyperlipidemia, unspecified: Secondary | ICD-10-CM | POA: Insufficient documentation

## 2017-05-09 DIAGNOSIS — I255 Ischemic cardiomyopathy: Secondary | ICD-10-CM

## 2017-05-09 DIAGNOSIS — Z79899 Other long term (current) drug therapy: Secondary | ICD-10-CM | POA: Insufficient documentation

## 2017-05-09 DIAGNOSIS — Z7982 Long term (current) use of aspirin: Secondary | ICD-10-CM | POA: Diagnosis not present

## 2017-05-09 DIAGNOSIS — K219 Gastro-esophageal reflux disease without esophagitis: Secondary | ICD-10-CM | POA: Diagnosis not present

## 2017-05-09 LAB — BASIC METABOLIC PANEL
Anion gap: 10 (ref 5–15)
BUN: 9 mg/dL (ref 6–20)
CO2: 26 mmol/L (ref 22–32)
CREATININE: 0.84 mg/dL (ref 0.61–1.24)
Calcium: 9.3 mg/dL (ref 8.9–10.3)
Chloride: 106 mmol/L (ref 101–111)
GFR calc Af Amer: >60 mL/min (ref >60)
GLUCOSE: 121 mg/dL — AB (ref 65–99)
POTASSIUM: 4.4 mmol/L (ref 3.5–5.1)
Sodium: 142 mmol/L (ref 135–145)

## 2017-05-09 LAB — CBC
HEMATOCRIT: 41.3 % (ref 39.0–52.0)
Hemoglobin: 12.8 g/dL — ABNORMAL LOW (ref 13.0–17.0)
MCH: 28.3 pg (ref 26.0–34.0)
MCHC: 31 g/dL (ref 30.0–36.0)
MCV: 91.4 fL (ref 78.0–100.0)
Platelets: 252 10*3/uL (ref 150–400)
RBC: 4.52 MIL/uL (ref 4.22–5.81)
RDW: 13.3 % (ref 11.5–15.5)
WBC: 7.4 10*3/uL (ref 4.0–10.5)

## 2017-05-09 MED ORDER — CARVEDILOL 6.25 MG PO TABS: 9.3750 mg | ORAL_TABLET | Freq: Two times a day (BID) | ORAL | 3 refills | Status: DC

## 2017-05-09 MED ORDER — SPIRONOLACTONE 25 MG PO TABS: 12.5000 mg | ORAL_TABLET | Freq: Every day | ORAL | 3 refills | Status: DC

## 2017-05-09 MED ORDER — FUROSEMIDE 20 MG PO TABS: 20.0000 mg | ORAL_TABLET | Freq: Every day | ORAL | 3 refills | Status: DC

## 2017-05-09 NOTE — Patient Instructions (Signed)
Start Spironolactone 12.5 mg (1/2 tab) daily  Start Furosemide 20 mg (1 tab) daily  Increase Carvedilol 9.375 mg (1.5 tab) daily  Your physician has requested that you have an echocardiogram. Echocardiography is a painless test that uses sound waves to create images of your heart. It provides your doctor with information about the size and shape of your heart and how well your heart's chambers and valves are working. This procedure takes approximately one hour. There are no restrictions for this procedure.  You have been referred to Cardiac Rehab (they will call you)   Fara ChuteErika pharm D will call you about Plavix and Brilinta   Labs drawn today (if we do not call you, then your lab work was stable)   Your physician recommends that you return for lab work in: 10-14 days with Dr. Shirlee LatchMcLean    Your physician recommends that you schedule a follow-up appointment in: 10-14 days with Dr. Shirlee LatchMcLean  an a echocardiogram

## 2017-05-10 ENCOUNTER — Telehealth (HOSPITAL_COMMUNITY): Payer: Self-pay

## 2017-05-10 NOTE — Progress Notes (Signed)
Patient ID: Ricardo Reed, male   DOB: 22-Oct-1931, 84 y.o.   MRN: [id number] Cardiology: Dr. Shirlee Latch  84 y.o. with history of CAD s/p CABG and redo CABG presents for cardiology followup.  He had his redo CABG in 5/10.  In 2/17, he had NSTEMI with LHC showing all the bypass grafts from CABG #2 patent, but he had culprit new 99% left main stenosis.  The LM was very short.  The territory at risk was very small due to patent bypass grafts.  PCI would have been extremely difficult, so he was managed medically.  Echo in 2/17 showed EF 40-45% with inferior/inferolateral akinesis (likely from a prior MI).  He was started on Plavix and Imdur.  Echo 12/17 showed EF 35-40%, basal to mid inferior/inferolateral akinesis, mild to moderate MR, mildly decreased RV systolic function.   In 2/19, he had STEMI (V1 and V2 STE) with cardiac arrest.  He had PCI to the left main into the proximal LAD with DES.  EF 25% on LV-gram.  He was treated in [place].   He returns for followup of CHF and CAD. He has done well s/p cardiac arrest.  He is now home, daughter is staying with him most of the time now.  He still has a sore chest from CPR.  No dyspnea walking on flat ground.  Less energy than in the past.  No orthopnea/PND. HR in 50s-60s with SBP 130s-150s at home.  He is on clopidogrel still but has a prescription for ticagrelor to get filled at the [facility].     Labs (6/14): LDL 51, HDL 35, K 4.1, creatinine 0.8, HCT 48 Labs (6/15): LDL 43, HDL 36.5, K 4.2, creatinine 0.9, HCT 46.7 Labs (2/17): K 4, creatinine 1.10, TSH normal, HCT 43 Labs (3/17): LDL 57 Labs (9/17): K 5, creatinine 0.92, BNP 174 Labs (1/19): LDL 52, HDL 34 Labs (2/19): K 4.5, creatinine 1.0  PMH: 1. CAD: CABG in 1980s.  NSTEMI in 2009.  Redo CABG in 5/10 => LIMA-LAD, SVG-D, SVG-OM.  - NSTEMI 2/17: LHC (2/17) with patent LIMA-LAD, SVG-PLOM, SVG-D, 99% stenosis short left main (new) but only small territory lost (small OM1).  LM lesion was likely culprit for MI.   Medical management.  - STEMI with STE V1/V2 in 2/19: LHC (in [place]) showed occluded LM, 99% pLAD, LIMA-LAD patent with severe disease after touchdown, SVG-D patent, SVG-OM totally occluded.  2. Asthma 3. HTN 4. Hyperlipidemia 5. GERD 6. Vertigo 5/16: Suspect BPPV.  Was hospitalized in [place].  MRI head negative for CVA, carotid dopplers without significant stenosis (per his report).  7. Sinus bradycardia 8. Ischemic cardiomyopathy: Echo (2/17) with EF 40-45%, akinesis of the inferolateral and inferior walls, PA systolic pressure 55 mmHg.  - Echo 12/17 showed EF 35-40%, basal to mid inferior/inferolateral akinesis, mild to moderate MR, mildly decreased RV systolic function.  - LV-gram (2/19) with EF 25%.  9. VT arrest 2/19 with MI  SH: Widowed, lives in [place].  Has children.  Quit smoking around 1980.  Occasional ETOH.   FH: Parents and brother with CAD.   ROS: All systems reviewed and negative except per HPI.    Current Outpatient Medications  Medication Sig Dispense Refill  . albuterol (PROVENTIL HFA) 108 (90 BASE) MCG/ACT inhaler Inhale 1 puff into the lungs as needed for wheezing or shortness of breath.     [name] aspirin 81 MG tablet Take 81 mg by mouth daily.      [name] atorvastatin (LIPITOR) 80 MG  tablet Take 1 tablet (80 mg total) by mouth daily at 6 PM. 30 tablet 11  . carvedilol (COREG) 6.25 MG tablet Take 1.5 tablets (9.375 mg total) by mouth 2 (two) times daily. 90 tablet 3  . clopidogrel (PLAVIX) 75 MG tablet Take 1 tablet (75 mg total) by mouth daily. 30 tablet 11  . diphenhydrAMINE (BENADRYL) 25 MG tablet Take 25 mg by mouth at bedtime.    . fluticasone (FLONASE) 50 MCG/ACT nasal spray Place 1 spray into both nostrils at bedtime. DAILY    . guaiFENesin (MUCINEX) 600 MG 12 hr tablet Take 600 mg by mouth 2 (two) times daily as needed for cough.    . isosorbide mononitrate (IMDUR) 30 MG 24 hr tablet Take 2 tablets (60 mg total) by mouth daily. 60 tablet 6  . mometasone  (ASMANEX) 220 MCG/INH inhaler Inhale 2 puffs into the lungs daily.    . naproxen sodium (ANAPROX) 220 MG tablet Take 220 mg by mouth daily.      . pantoprazole (PROTONIX) 40 MG tablet Take 1 tablet (40 mg total) by mouth daily at 6 PM. 30 tablet 5  . sacubitril-valsartan (ENTRESTO) 24-26 MG Take 1 tablet by mouth 2 (two) times daily. 60 tablet 11  . furosemide (LASIX) 20 MG tablet Take 1 tablet (20 mg total) by mouth daily. 30 tablet 3  . spironolactone (ALDACTONE) 25 MG tablet Take 0.5 tablets (12.5 mg total) by mouth daily. 15 tablet 3   No current facility-administered medications for this encounter.     BP (!) 168/99 (BP Location: Right Arm, Patient Position: Sitting, Cuff Size: Normal)   Pulse 68   Wt 161 lb (73 kg)   SpO2 100%   BMI 24.48 kg/m  General: NAD Neck:JVP 8-9 cm, no thyromegaly or thyroid nodule.  Lungs: Clear to auscultation bilaterally with normal respiratory effort. CV: Nondisplaced PMI.  Heart regular S1/S2, no S3/S4, no murmur.  1+ ankle edema.  No carotid bruit.  Normal pedal pulses.  Abdomen: Soft, nontender, no hepatosplenomegaly, no distention.  Skin: Intact without lesions or rashes.  Neurologic: Alert and oriented x 3.  Psych: Normal affect. Extremities: No clubbing or cyanosis.  HEENT: Normal.   Assessment/Plan: 1. CAD: S/p CABG.  NSTEMI 2/17 with culprit 99% subtotal occlusion of left main managed medically given small territory lost (small OM1) and very difficult for intervention.  Bypass grafts from CABG #2 were all patent. He had MI again in 2/19 with cardiac arrest.  LHC showed occluded LM with patent LIMA-LAD and SVG-D but occluded SVG-OM1.  MI was thought to be due to left main occlusion and he had DES from LM into LAD. I wonder, however, if the culprit was not occlusion of SVG-OM1.   - Continue ASA 81.  - He is currently on clopidogrel but has a prescription for ticagrelor to fill at the [facility] in [place]. I agree with taking ticagrelor 90 mg bid  for 1 year then switching back to Plavix.  Our pharmacist talked to the patient about making the transition from Plavix to ticagrelor.  - Continue atorvastatin 80 daily.  - Continue Imdur 30 mg daily, Coreg.  - Refer to cardiac rehab in [place].  2. HTN: BP still runs high at times. Increase Coreg today and adding spironolactone.   3. Hyperlipidemia: Good lipids in 1/19.    4. Ischemic cardiomyopathy: EF 35-40% on 2017 echo, EF 25% on LV-gram from cath in [place] in 2/19. On exam, he is volume overloaded. NYHA class II  symptoms.  - Add Lasix 20 mg daily.  - Add spironolactone 12.5 mg daily.  - Increase Coreg to 9.375 mg bid.  - Continue Entresto 49/51 bid - Echo at followup appt.  - BMET today and repeat in 10 days.   Followup in 10-14 days.      Marca AnconaDalton Eiden Bagot 05/10/2017

## 2017-05-10 NOTE — Telephone Encounter (Signed)
Left a message with the Cardiac Rehab at [facility] at [phone] to refer patient.

## 2017-05-11 ENCOUNTER — Telehealth (HOSPITAL_COMMUNITY): Payer: Self-pay | Admitting: Pharmacist

## 2017-05-11 NOTE — Telephone Encounter (Signed)
Per package insert, patient can start Brilinta 24 hours after last dose of Plavix. I have called and left VM for patient with these instructions and to call me with any questions or concerns.   Tyler DeisErika K. Bonnye FavaNicolsen, PharmD, BCPS, CPP Clinical Pharmacist Phone: [phone] 05/11/2017 12:04 PM

## 2017-05-21 ENCOUNTER — Encounter (HOSPITAL_COMMUNITY): Payer: Medicare Other | Admitting: Cardiology

## 2017-05-22 ENCOUNTER — Other Ambulatory Visit: Payer: Self-pay

## 2017-05-22 ENCOUNTER — Ambulatory Visit (HOSPITAL_BASED_OUTPATIENT_CLINIC_OR_DEPARTMENT_OTHER)
Admission: RE | Admit: 2017-05-22 | Discharge: 2017-05-22 | Disposition: A | Discharge status: Home / Self Care | Payer: Medicare Other | Source: Ambulatory Visit | Attending: Cardiology | Admitting: Cardiology

## 2017-05-22 ENCOUNTER — Ambulatory Visit (HOSPITAL_COMMUNITY)
Admission: RE | Admit: 2017-05-22 | Discharge: 2017-05-22 | Disposition: A | Discharge status: Home / Self Care | Payer: Medicare Other | Source: Ambulatory Visit | Attending: Cardiology | Admitting: Cardiology

## 2017-05-22 ENCOUNTER — Encounter (HOSPITAL_COMMUNITY): Payer: Self-pay | Admitting: Cardiology

## 2017-05-22 VITALS — BP 114/56 | HR 56 | Wt 154.2 lb

## 2017-05-22 DIAGNOSIS — E785 Hyperlipidemia, unspecified: Secondary | ICD-10-CM | POA: Insufficient documentation

## 2017-05-22 DIAGNOSIS — Z7902 Long term (current) use of antithrombotics/antiplatelets: Secondary | ICD-10-CM | POA: Insufficient documentation

## 2017-05-22 DIAGNOSIS — Z87891 Personal history of nicotine dependence: Secondary | ICD-10-CM | POA: Insufficient documentation

## 2017-05-22 DIAGNOSIS — Z7982 Long term (current) use of aspirin: Secondary | ICD-10-CM | POA: Insufficient documentation

## 2017-05-22 DIAGNOSIS — K219 Gastro-esophageal reflux disease without esophagitis: Secondary | ICD-10-CM | POA: Insufficient documentation

## 2017-05-22 DIAGNOSIS — I10 Essential (primary) hypertension: Secondary | ICD-10-CM | POA: Insufficient documentation

## 2017-05-22 DIAGNOSIS — I255 Ischemic cardiomyopathy: Secondary | ICD-10-CM

## 2017-05-22 DIAGNOSIS — J45909 Unspecified asthma, uncomplicated: Secondary | ICD-10-CM | POA: Insufficient documentation

## 2017-05-22 DIAGNOSIS — I25810 Atherosclerosis of coronary artery bypass graft(s) without angina pectoris: Secondary | ICD-10-CM | POA: Insufficient documentation

## 2017-05-22 DIAGNOSIS — Z79899 Other long term (current) drug therapy: Secondary | ICD-10-CM | POA: Diagnosis not present

## 2017-05-22 DIAGNOSIS — Z8674 Personal history of sudden cardiac arrest: Secondary | ICD-10-CM | POA: Diagnosis not present

## 2017-05-22 DIAGNOSIS — I252 Old myocardial infarction: Secondary | ICD-10-CM | POA: Insufficient documentation

## 2017-05-22 LAB — BASIC METABOLIC PANEL
Anion gap: 10 (ref 5–15)
BUN: 25 mg/dL — AB (ref 6–20)
CALCIUM: 9.7 mg/dL (ref 8.9–10.3)
CHLORIDE: 102 mmol/L (ref 101–111)
CO2: 26 mmol/L (ref 22–32)
CREATININE: 0.98 mg/dL (ref 0.61–1.24)
GFR calc Af Amer: >60 mL/min (ref >60)
GFR calc non Af Amer: >60 mL/min (ref >60)
GLUCOSE: 126 mg/dL — AB (ref 65–99)
Potassium: 4.7 mmol/L (ref 3.5–5.1)
Sodium: 138 mmol/L (ref 135–145)

## 2017-05-22 MED ORDER — SPIRONOLACTONE 25 MG PO TABS: 25.0000 mg | ORAL_TABLET | Freq: Every day | ORAL | 3 refills | Status: DC

## 2017-05-22 NOTE — Patient Instructions (Signed)
Increase Spironolactone 25 mg (1 tab) daily  You have been referred to Cardiac Rehab (they will call you)   Labs drawn today (if we do not call you, then your lab work was stable)   Your physician recommends that you return for lab work in: 2 weeks Rx given  Your physician recommends that you schedule a follow-up appointment in: 2 months with Dr. Shirlee LatchMcLean

## 2017-05-22 NOTE — Progress Notes (Signed)
  Echocardiogram 2D Echocardiogram has been performed.  Ricardo Reed Ricardo Reed 05/22/2017, 9:35 AM

## 2017-05-23 NOTE — Progress Notes (Signed)
Patient ID: Ricardo Reed, male   DOB: 1931-07-17, 84 y.o.   MRN: [id number] Cardiology: Dr. Shirlee Latch  84 y.o. with history of CAD s/p CABG and redo CABG presents for cardiology followup.  He had his redo CABG in 5/10.  In 2/17, he had NSTEMI with LHC showing all the bypass grafts from CABG #2 patent, but he had culprit new 99% left main stenosis.  The LM was very short.  The territory at risk was very small due to patent bypass grafts.  PCI would have been extremely difficult, so he was managed medically.  Echo in 2/17 showed EF 40-45% with inferior/inferolateral akinesis (likely from a prior MI).  He was started on Plavix and Imdur.  Echo 12/17 showed EF 35-40%, basal to mid inferior/inferolateral akinesis, mild to moderate MR, mildly decreased RV systolic function.   In 2/19, he had STEMI (V1 and V2 STE) with cardiac arrest.  He had PCI to the left main into the proximal LAD with DES.  EF 25% on LV-gram.  He was treated in [place].   Echo done today and reviewed in the office shows EF 35% with akinetic basal to mid inferior and inferolateral walls and severely hypokinetic inferoseptal wall. Normal RV with mildly decreased systolic function.   He returns for followup of CHF and CAD. He has done well s/p cardiac arrest.  He still has a sore chest from CPR.  At last appointment, I started him on Lasix.  Weight is down 7 lbs.  He is feeling better overall.  No significant dyspnea now.  He walks 200 yards back and forth from his mailbox without problems.  No lightheadedness or dizziness.  No falls.      Labs (6/14): LDL 51, HDL 35, K 4.1, creatinine 0.8, HCT 48 Labs (6/15): LDL 43, HDL 36.5, K 4.2, creatinine 0.9, HCT 46.7 Labs (2/17): K 4, creatinine 1.10, TSH normal, HCT 43 Labs (3/17): LDL 57 Labs (9/17): K 5, creatinine 0.92, BNP 174 Labs (1/19): LDL 52, HDL 34 Labs (2/19): K 4.5, creatinine 1.0 => 0.84, hgb 12.8  PMH: 1. CAD: CABG in 1980s.  NSTEMI in 2009.  Redo CABG in 5/10 => LIMA-LAD, SVG-D,  SVG-OM.   - NSTEMI 2/17: LHC (2/17, [place]) with patent LIMA-LAD, SVG-PLOM, SVG-D, 99% stenosis short left main (new) but only small territory lost (small OM1).  LM lesion was likely culprit for MI.  Medical management.  - STEMI with STE V1/V2 in 2/19: LHC (in [place]) showed occluded LM, 99% pLAD, LIMA-LAD patent with severe disease after touchdown, SVG-D patent, SVG-OM totally occluded.  2. Asthma 3. HTN 4. Hyperlipidemia 5. GERD 6. Vertigo 5/16: Suspect BPPV.  Was hospitalized in [place].  MRI head negative for CVA, carotid dopplers without significant stenosis (per his report).  7. Sinus bradycardia 8. Ischemic cardiomyopathy: Echo (2/17) with EF 40-45%, akinesis of the inferolateral and inferior walls, PA systolic pressure 55 mmHg.  - Echo 12/17 showed EF 35-40%, basal to mid inferior/inferolateral akinesis, mild to moderate MR, mildly decreased RV systolic function.  - LV-gram (2/19) with EF 25%.  - Echo (2/19): EF 35% with akinetic basal to mid inferior and inferolateral walls and severely hypokinetic inferoseptal wall. Normal RV with mildly decreased systolic function.  9. VT arrest 2/19 with MI  SH: Widowed, lives in [place].  Has children.  Quit smoking around 1980.  Occasional ETOH.   FH: Parents and brother with CAD.   ROS: All systems reviewed and negative except per HPI.  Current Outpatient Medications  Medication Sig Dispense Refill  . albuterol (PROVENTIL HFA) 108 (90 BASE) MCG/ACT inhaler Inhale 1 puff into the lungs as needed for wheezing or shortness of breath.     [name]. aspirin 81 MG tablet Take 81 mg by mouth daily.      [name]. atorvastatin (LIPITOR) 80 MG tablet Take 1 tablet (80 mg total) by mouth daily at 6 PM. 30 tablet 11  . carvedilol (COREG) 6.25 MG tablet Take 1.5 tablets (9.375 mg total) by mouth 2 (two) times daily. 90 tablet 3  . diphenhydrAMINE (BENADRYL) 25 MG tablet Take 25 mg by mouth at bedtime.    . fluticasone (FLONASE) 50 MCG/ACT nasal spray  Place 1 spray into both nostrils at bedtime. DAILY    . furosemide (LASIX) 20 MG tablet Take 1 tablet (20 mg total) by mouth daily. 30 tablet 3  . guaiFENesin (MUCINEX) 600 MG 12 hr tablet Take 600 mg by mouth 2 (two) times daily as needed for cough.    . isosorbide mononitrate (IMDUR) 30 MG 24 hr tablet Take 30 mg by mouth daily.    . mometasone (ASMANEX) 220 MCG/INH inhaler Inhale 2 puffs into the lungs daily.    . naproxen sodium (ANAPROX) 220 MG tablet Take 220 mg by mouth daily.      . pantoprazole (PROTONIX) 40 MG tablet Take 1 tablet (40 mg total) by mouth daily at 6 PM. 30 tablet 5  . sacubitril-valsartan (ENTRESTO) 49-51 MG Take 1 tablet by mouth 2 (two) times daily.    [name]. spironolactone (ALDACTONE) 25 MG tablet Take 1 tablet (25 mg total) by mouth daily. 30 tablet 3  . ticagrelor (BRILINTA) 90 MG TABS tablet Take 90 mg by mouth 2 (two) times daily.     No current facility-administered medications for this encounter.     BP (!) 114/56   Pulse (!) 56   Wt 154 lb 4 oz (70 kg)   SpO2 100%   BMI 23.45 kg/m  General: NAD Neck: No JVD, no thyromegaly or thyroid nodule.  Lungs: Clear to auscultation bilaterally with normal respiratory effort. CV: Nondisplaced PMI.  Heart regular S1/S2, no S3/S4, no murmur.  No peripheral edema.  No carotid bruit.  Normal pedal pulses.  Abdomen: Soft, nontender, no hepatosplenomegaly, no distention.  Skin: Intact without lesions or rashes.  Neurologic: Alert and oriented x 3.  Psych: Normal affect. Extremities: No clubbing or cyanosis.  HEENT: Normal.   Assessment/Plan: 1. CAD: S/p CABG.  NSTEMI 2/17 with culprit 99% subtotal occlusion of left main managed medically given small territory lost (small OM1) and very difficult for intervention.  Bypass grafts from CABG #2 were all patent. He had MI again in 2/19 with cardiac arrest.  LHC showed occluded LM with patent LIMA-LAD and SVG-D but occluded SVG-OM1.  MI was thought to be due to left main  occlusion and he had DES from LM into LAD. I wonder, however, if the culprit was not occlusion of SVG-OM1.   - Continue ASA 81.  - He will continue ticagrelor 90 mg bid for 1 year then switch back to Plavix.   - Continue atorvastatin 80 daily.  - Continue Imdur 30 mg daily, Coreg.  - Referred to cardiac rehab in [place].  2. HTN: BP controlled.   3. Hyperlipidemia: Good lipids in 1/19.    4. Ischemic cardiomyopathy: EF 35-40% on 2017 echo, EF 25% on LV-gram from cath in [place] in 2/19. I reviewed today's echo => EF is  35% with wall motion abnormalities in the inferior, inferolateral, and inferoseptal walls.  At last appointment, he appeared volume overloaded on exam and Lasix was started.  Today, he is not volume overloaded on exam and symptoms are NYHA class II.  - Continue Lasix 20 mg daily with BMET today.   - Increase spironolactone to 25 mg daily, BMET in 2 wks (can get in [place] and fax up to [place]).  - Continue Coreg 9.375 mg bid.  - Continue Entresto 49/51 bid  Followup with me in 2 months.      [name] 05/23/2017

## 2017-06-13 ENCOUNTER — Telehealth (HOSPITAL_COMMUNITY): Payer: Self-pay | Admitting: Cardiology

## 2017-06-13 MED ORDER — SPIRONOLACTONE 25 MG PO TABS: 12.5000 mg | ORAL_TABLET | Freq: Every day | ORAL | 3 refills | Status: DC

## 2017-06-13 NOTE — Telephone Encounter (Signed)
Abnormal labs received  Labs drawn 3.15.19 Cr 1.32 K 5.6 Na 140  Per VO Joanell RisingAndy Tillery,PA Decrease spiro to 12.5 mg Repeat in one week  Patient aware and voiced understanding Order mailed to patients address

## 2017-06-15 ENCOUNTER — Other Ambulatory Visit (HOSPITAL_COMMUNITY): Payer: Self-pay | Admitting: Cardiology

## 2017-08-07 ENCOUNTER — Other Ambulatory Visit: Payer: Self-pay

## 2017-08-07 ENCOUNTER — Ambulatory Visit (HOSPITAL_COMMUNITY)
Admission: RE | Admit: 2017-08-07 | Discharge: 2017-08-07 | Disposition: A | Discharge status: Home / Self Care | Payer: Medicare Other | Source: Ambulatory Visit | Attending: Cardiology | Admitting: Cardiology

## 2017-08-07 ENCOUNTER — Encounter (HOSPITAL_COMMUNITY): Payer: Self-pay | Admitting: Cardiology

## 2017-08-07 VITALS — BP 124/60 | HR 52 | Wt 154.8 lb

## 2017-08-07 DIAGNOSIS — E785 Hyperlipidemia, unspecified: Secondary | ICD-10-CM | POA: Insufficient documentation

## 2017-08-07 DIAGNOSIS — Z87891 Personal history of nicotine dependence: Secondary | ICD-10-CM | POA: Insufficient documentation

## 2017-08-07 DIAGNOSIS — Z951 Presence of aortocoronary bypass graft: Secondary | ICD-10-CM | POA: Insufficient documentation

## 2017-08-07 DIAGNOSIS — J45909 Unspecified asthma, uncomplicated: Secondary | ICD-10-CM | POA: Insufficient documentation

## 2017-08-07 DIAGNOSIS — I2510 Atherosclerotic heart disease of native coronary artery without angina pectoris: Secondary | ICD-10-CM | POA: Diagnosis not present

## 2017-08-07 DIAGNOSIS — R001 Bradycardia, unspecified: Secondary | ICD-10-CM | POA: Diagnosis not present

## 2017-08-07 DIAGNOSIS — Z7902 Long term (current) use of antithrombotics/antiplatelets: Secondary | ICD-10-CM | POA: Diagnosis not present

## 2017-08-07 DIAGNOSIS — I255 Ischemic cardiomyopathy: Secondary | ICD-10-CM | POA: Diagnosis not present

## 2017-08-07 DIAGNOSIS — Z7951 Long term (current) use of inhaled steroids: Secondary | ICD-10-CM | POA: Insufficient documentation

## 2017-08-07 DIAGNOSIS — I10 Essential (primary) hypertension: Secondary | ICD-10-CM | POA: Insufficient documentation

## 2017-08-07 DIAGNOSIS — Z79899 Other long term (current) drug therapy: Secondary | ICD-10-CM | POA: Diagnosis not present

## 2017-08-07 DIAGNOSIS — K219 Gastro-esophageal reflux disease without esophagitis: Secondary | ICD-10-CM | POA: Diagnosis not present

## 2017-08-07 DIAGNOSIS — Z7982 Long term (current) use of aspirin: Secondary | ICD-10-CM | POA: Diagnosis not present

## 2017-08-07 DIAGNOSIS — Z8674 Personal history of sudden cardiac arrest: Secondary | ICD-10-CM | POA: Diagnosis not present

## 2017-08-07 DIAGNOSIS — I25810 Atherosclerosis of coronary artery bypass graft(s) without angina pectoris: Secondary | ICD-10-CM | POA: Diagnosis not present

## 2017-08-07 DIAGNOSIS — I252 Old myocardial infarction: Secondary | ICD-10-CM | POA: Insufficient documentation

## 2017-08-07 NOTE — Patient Instructions (Signed)
Your physician recommends that you return for lab work in: July, 2019 Rx given   Your physician recommends that you schedule a follow-up appointment in: 4 months with Dr. Shirlee Latch

## 2017-08-08 NOTE — Progress Notes (Signed)
Patient ID: Ricardo Reed, male   DOB: [birth year], 84 y.o.   MRN: [id number] PCP: Dr. Kreg Shropshire Cardiology: Dr. Shirlee Latch  84 y.o. with history of CAD s/p CABG and redo CABG presents for cardiology followup.  He had his redo CABG in 5/10.  In 2/17, he had NSTEMI with LHC showing all the bypass grafts from CABG #2 patent, but he had culprit new 99% left main stenosis.  The LM was very short.  The territory at risk was very small due to patent bypass grafts.  PCI would have been extremely difficult, so he was managed medically.  Echo in 2/17 showed EF 40-45% with inferior/inferolateral akinesis (likely from a prior MI).  He was started on Plavix and Imdur.  Echo 12/17 showed EF 35-40%, basal to mid inferior/inferolateral akinesis, mild to moderate MR, mildly decreased RV systolic function.   In 2/19, he had STEMI (V1 and V2 STE) with cardiac arrest.  He had PCI to the left main into the proximal LAD with DES.  EF 25% on LV-gram.  He was treated in [place].   Echo in 2/19 shows EF 35% with akinetic basal to mid inferior and inferolateral walls and severely hypokinetic inferoseptal wall. Normal RV with mildly decreased systolic function.   He returns for followup of CHF and CAD. Spironolactone was decreased back to 12.5 mg daily with elevated K at 25 mg daily. He has been doing quite well.  He is feeling stronger and walking about a mile a day for exercise. He golfs 1-2 times/wk, shot an 83 three times this month. No exertional dyspnea at this point.  No chest pain.  No orthopnea/PND. Occasional lightheadedness if he stands fast.  BP good in the office today but soft at times at home, SBP in 90s-100s generally on the readings that he brings me.   ECG (personally reviewed): NSR at 53, 1st degree AVB, nonspecific TWF  Labs (6/14): LDL 51, HDL 35, K 4.1, creatinine 0.8, HCT 48 Labs (6/15): LDL 43, HDL 36.5, K 4.2, creatinine 0.9, HCT 46.7 Labs (2/17): K 4, creatinine 1.10, TSH normal, HCT 43 Labs  (3/17): LDL 57 Labs (9/17): K 5, creatinine 0.92, BNP 174 Labs (1/19): LDL 52, HDL 34 Labs (2/19): K 4.5, creatinine 1.0 => 0.84, hgb 12.8 Labs (4/19): BNP 273, LDL 40, K 4, creatinine 0.9  PMH: 1. CAD: CABG in 1980s.  NSTEMI in 2009.  Redo CABG in 5/10 => LIMA-LAD, SVG-D, SVG-OM.   - NSTEMI 2/17: LHC (2/17, ) with patent LIMA-LAD, SVG-PLOM, SVG-D, 99% stenosis short left main (new) but only small territory lost (small OM1).  LM lesion was likely culprit for MI.  Medical management.  - STEMI with STE V1/V2 in 2/19: LHC (in [place]) showed occluded LM, 99% pLAD, LIMA-LAD patent with severe disease after touchdown, SVG-D patent, SVG-PLOM totally occluded.  2. Asthma 3. HTN 4. Hyperlipidemia 5. GERD 6. Vertigo 5/16: Suspect BPPV.  Was hospitalized in [place].  MRI head negative for CVA, carotid dopplers without significant stenosis (per his report).  7. Sinus bradycardia 8. Ischemic cardiomyopathy: Echo (2/17) with EF 40-45%, akinesis of the inferolateral and inferior walls, PA systolic pressure 55 mmHg.  - Echo 12/17 showed EF 35-40%, basal to mid inferior/inferolateral akinesis, mild to moderate MR, mildly decreased RV systolic function.  - LV-gram (2/19) with EF 25%.  - Echo (2/19): EF 35% with akinetic basal to mid inferior and inferolateral walls and severely hypokinetic inferoseptal wall. Normal RV with mildly decreased systolic function.  9.  VT arrest 2/19 with MI  SH: Widowed, lives in [place].  Has children.  Quit smoking around 1980.  Occasional ETOH.   FH: Parents and brother with CAD.   ROS: All systems reviewed and negative except per HPI.    Current Outpatient Medications  Medication Sig Dispense Refill  . albuterol (PROVENTIL HFA) 108 (90 BASE) MCG/ACT inhaler Inhale 1 puff into the lungs as needed for wheezing or shortness of breath.     [name] aspirin 81 MG tablet Take 81 mg by mouth daily.      [name] atorvastatin (LIPITOR) 80 MG tablet Take 1 tablet (80 mg  total) by mouth daily at 6 PM. 30 tablet 11  . carvedilol (COREG) 6.25 MG tablet Take 1.5 tablets (9.375 mg total) by mouth 2 (two) times daily. 90 tablet 3  . diphenhydrAMINE (BENADRYL) 25 MG tablet Take 25 mg by mouth at bedtime.    . fluticasone (FLONASE) 50 MCG/ACT nasal spray Place 1 spray into both nostrils at bedtime. DAILY    . furosemide (LASIX) 20 MG tablet Take 1 tablet (20 mg total) by mouth daily. 30 tablet 3  . guaiFENesin (MUCINEX) 600 MG 12 hr tablet Take 600 mg by mouth 2 (two) times daily as needed for cough.    . isosorbide mononitrate (IMDUR) 30 MG 24 hr tablet Take 30 mg by mouth daily.    . mometasone (ASMANEX) 220 MCG/INH inhaler Inhale 2 puffs into the lungs daily.    . naproxen sodium (ANAPROX) 220 MG tablet Take 220 mg by mouth daily.      . pantoprazole (PROTONIX) 40 MG tablet Take 1 tablet (40 mg total) by mouth daily at 6 PM. 30 tablet 5  . sacubitril-valsartan (ENTRESTO) 49-51 MG Take 1 tablet by mouth 2 (two) times daily.    [name] spironolactone (ALDACTONE) 25 MG tablet Take 0.5 tablets (12.5 mg total) by mouth daily. 15 tablet 3  . ticagrelor (BRILINTA) 90 MG TABS tablet Take 90 mg by mouth 2 (two) times daily.     No current facility-administered medications for this encounter.     BP 124/60   Pulse (!) 52   Wt 154 lb 12 oz (70.2 kg)   SpO2 100%   BMI 23.53 kg/m  General: NAD Neck: No JVD, no thyromegaly or thyroid nodule.  Lungs: Clear to auscultation bilaterally with normal respiratory effort. CV: Nondisplaced PMI.  Heart regular S1/S2, no S3/S4, no murmur.  No peripheral edema.  No carotid bruit.  Normal pedal pulses.  Abdomen: Soft, nontender, no hepatosplenomegaly, no distention.  Skin: Intact without lesions or rashes.  Neurologic: Alert and oriented x 3.  Psych: Normal affect. Extremities: No clubbing or cyanosis.  HEENT: Normal.   Assessment/Plan: 1. CAD: S/p CABG.  NSTEMI 2/17 with culprit 99% subtotal occlusion of left main managed medically  given small territory lost (small OM1) and very difficult for intervention.  Bypass grafts from CABG #2 were all patent. He had MI again in 2/19 with cardiac arrest.  LHC showed occluded LM with patent LIMA-LAD and SVG-D but occluded SVG-PLOM.  MI was thought to be due to left main occlusion and he had DES from LM into LAD. I wonder, however, if the culprit was not occlusion of SVG-PLOM.   - Continue ASA 81.  - He will continue ticagrelor 90 mg bid for 1 year then switch back to Plavix.   - Continue atorvastatin 80 daily, good LDL in 4/19.   - Continue Imdur 30 mg daily, Coreg.  2. HTN: BP tends to run low now.   3. Hyperlipidemia: Good lipids in 4/19.    4. Ischemic cardiomyopathy: EF 35-40% on 2017 echo, EF 25% on LV-gram from cath in [place] in 2/19.  Echo in 2/19 showed EF 35% with wall motion abnormalities in the inferior, inferolateral, and inferoseptal walls.  Today, he is not volume overloaded on exam and symptoms are NYHA class II. BP is good in the office today, but SBP in 90s-100s on home readings.  I do not think that we are going to be able to titrate up his HF meds.  - Continue Lasix 20 mg daily.   - Keep spironolactone dose at 12.5 mg daily (hyperkalemia when increased to 25 daily).   - Continue Coreg 9.375 mg bid, no BP room to increase based on home readings.  - Continue Entresto 49/51 bid, no BP room to increase based on home readings.  - Repeat BMET in 7/19, he can have this done in [place].   Followup with me in 4 months.      [name] 08/08/2017

## 2017-08-21 ENCOUNTER — Telehealth (HOSPITAL_COMMUNITY): Payer: Self-pay | Admitting: *Deleted

## 2017-08-21 NOTE — Telephone Encounter (Signed)
Pt called concerned about BP.  He states it has been running 90-100s at home and feels fatigued and dizzy at times.  He states Sunday his BP was 75/41 so he did not take Belva Agee it was 105/59 and 120/57 today.  He would like to know if he can stop the [place] or not.  Will send to Dr Shirlee Latch for review

## 2017-08-22 MED ORDER — SPIRONOLACTONE 25 MG PO TABS: 12.5000 mg | ORAL_TABLET | Freq: Every day | ORAL | 3 refills | Status: DC

## 2017-08-22 MED ORDER — CARVEDILOL 6.25 MG PO TABS: 6.2500 mg | ORAL_TABLET | Freq: Two times a day (BID) | ORAL | 3 refills | Status: DC

## 2017-08-22 NOTE — Telephone Encounter (Signed)
Pt aware and agreeable, med list updated, pt will continue to monitor BP and call us back as needed

## 2017-08-22 NOTE — Telephone Encounter (Signed)
Move spironolactone to qhs and decrease Coreg to 6.25 mg bid.

## 2017-08-27 ENCOUNTER — Other Ambulatory Visit: Payer: Self-pay | Admitting: *Deleted

## 2017-08-27 MED ORDER — FUROSEMIDE 20 MG PO TABS: 20.0000 mg | ORAL_TABLET | Freq: Every day | ORAL | 3 refills | Status: DC

## 2017-08-30 ENCOUNTER — Other Ambulatory Visit (HOSPITAL_COMMUNITY): Payer: Self-pay | Admitting: *Deleted

## 2017-08-30 MED ORDER — FUROSEMIDE 20 MG PO TABS: 20.0000 mg | ORAL_TABLET | Freq: Every day | ORAL | 3 refills | Status: DC

## 2017-08-30 MED ORDER — CARVEDILOL 6.25 MG PO TABS: 6.2500 mg | ORAL_TABLET | Freq: Two times a day (BID) | ORAL | 3 refills | Status: DC

## 2017-08-30 MED ORDER — SPIRONOLACTONE 25 MG PO TABS: 12.5000 mg | ORAL_TABLET | Freq: Every day | ORAL | 3 refills | Status: DC

## 2017-10-31 ENCOUNTER — Other Ambulatory Visit (HOSPITAL_COMMUNITY): Payer: Self-pay | Admitting: Cardiology

## 2017-11-05 ENCOUNTER — Other Ambulatory Visit (HOSPITAL_COMMUNITY): Payer: Self-pay | Admitting: Cardiology

## 2017-11-21 ENCOUNTER — Other Ambulatory Visit (HOSPITAL_COMMUNITY): Payer: Self-pay | Admitting: Cardiology

## 2017-12-14 ENCOUNTER — Ambulatory Visit (HOSPITAL_COMMUNITY)
Admission: RE | Admit: 2017-12-14 | Discharge: 2017-12-14 | Disposition: A | Discharge status: Home / Self Care | Payer: Medicare Other | Source: Ambulatory Visit | Attending: Cardiology | Admitting: Cardiology

## 2017-12-14 VITALS — BP 122/66 | HR 51 | Wt 153.0 lb

## 2017-12-14 DIAGNOSIS — E785 Hyperlipidemia, unspecified: Secondary | ICD-10-CM | POA: Insufficient documentation

## 2017-12-14 DIAGNOSIS — I255 Ischemic cardiomyopathy: Secondary | ICD-10-CM | POA: Diagnosis not present

## 2017-12-14 DIAGNOSIS — Z79899 Other long term (current) drug therapy: Secondary | ICD-10-CM | POA: Diagnosis not present

## 2017-12-14 DIAGNOSIS — Z8674 Personal history of sudden cardiac arrest: Secondary | ICD-10-CM | POA: Insufficient documentation

## 2017-12-14 DIAGNOSIS — I21A1 Myocardial infarction type 2: Secondary | ICD-10-CM | POA: Diagnosis not present

## 2017-12-14 DIAGNOSIS — K219 Gastro-esophageal reflux disease without esophagitis: Secondary | ICD-10-CM | POA: Insufficient documentation

## 2017-12-14 DIAGNOSIS — Z09 Encounter for follow-up examination after completed treatment for conditions other than malignant neoplasm: Secondary | ICD-10-CM | POA: Insufficient documentation

## 2017-12-14 DIAGNOSIS — Z87891 Personal history of nicotine dependence: Secondary | ICD-10-CM | POA: Insufficient documentation

## 2017-12-14 DIAGNOSIS — J45909 Unspecified asthma, uncomplicated: Secondary | ICD-10-CM | POA: Insufficient documentation

## 2017-12-14 DIAGNOSIS — I25810 Atherosclerosis of coronary artery bypass graft(s) without angina pectoris: Secondary | ICD-10-CM | POA: Diagnosis not present

## 2017-12-14 DIAGNOSIS — I10 Essential (primary) hypertension: Secondary | ICD-10-CM | POA: Insufficient documentation

## 2017-12-14 DIAGNOSIS — Z7982 Long term (current) use of aspirin: Secondary | ICD-10-CM | POA: Insufficient documentation

## 2017-12-14 DIAGNOSIS — I252 Old myocardial infarction: Secondary | ICD-10-CM | POA: Diagnosis not present

## 2017-12-14 LAB — CBC
HCT: 44.7 % (ref 39.0–52.0)
Hemoglobin: 14.3 g/dL (ref 13.0–17.0)
MCH: 30.4 pg (ref 26.0–34.0)
MCHC: 32 g/dL (ref 30.0–36.0)
MCV: 94.9 fL (ref 78.0–100.0)
PLATELETS: 193 10*3/uL (ref 150–400)
RBC: 4.71 MIL/uL (ref 4.22–5.81)
RDW: 12.4 % (ref 11.5–15.5)
WBC: 8.9 10*3/uL (ref 4.0–10.5)

## 2017-12-16 NOTE — Progress Notes (Signed)
Patient ID: Ricardo Reed, male   DOB: [birth year], 84 y.o.   MRN: [id number] PCP: Dr. Kreg Shropshire Cardiology: Dr. Shirlee Latch  84 y.o. with history of CAD s/p CABG and redo CABG presents for cardiology followup.  He had his redo CABG in 5/10.  In 2/17, he had NSTEMI with LHC showing all the bypass grafts from CABG #2 patent, but he had culprit new 99% left main stenosis.  The LM was very short.  The territory at risk was very small due to patent bypass grafts.  PCI would have been extremely difficult, so he was managed medically.  Echo in 2/17 showed EF 40-45% with inferior/inferolateral akinesis (likely from a prior MI).  He was started on Plavix and Imdur.  Echo 12/17 showed EF 35-40%, basal to mid inferior/inferolateral akinesis, mild to moderate MR, mildly decreased RV systolic function.   In 2/19, he had STEMI (V1 and V2 STE) with cardiac arrest.  He had PCI to the left main into the proximal LAD with DES.  EF 25% on LV-gram.  He was treated in [place].   Echo in 2/19 shows EF 35% with akinetic basal to mid inferior and inferolateral walls and severely hypokinetic inferoseptal wall. Normal RV with mildly decreased systolic function.   He returns for followup of CHF and CAD. He has been doing quite well recently.  Walking 1.25 miles/day, plans to gradually increase to 2 miles/day.  He is golfing regularly.  No exertional dyspnea.  No orthopnea/PND.  No chest pain.  Occasional lightheadedness with standing, no falls.  Weight down 1 lb.   Labs (6/14): LDL 51, HDL 35, K 4.1, creatinine 0.8, HCT 48 Labs (6/15): LDL 43, HDL 36.5, K 4.2, creatinine 0.9, HCT 46.7 Labs (2/17): K 4, creatinine 1.10, TSH normal, HCT 43 Labs (3/17): LDL 57 Labs (9/17): K 5, creatinine 0.92, BNP 174 Labs (1/19): LDL 52, HDL 34 Labs (2/19): K 4.5, creatinine 1.0 => 0.84, hgb 12.8 Labs (4/19): BNP 273, LDL 40, K 4, creatinine 0.9 Labs (7/19): K 5.1, creatinine 0.9  PMH: 1. CAD: CABG in 1980s.  NSTEMI in 2009.  Redo  CABG in 5/10 => LIMA-LAD, SVG-D, SVG-OM.   - NSTEMI 2/17: LHC (2/17, [place]) with patent LIMA-LAD, SVG-PLOM, SVG-D, 99% stenosis short left main (new) but only small territory lost (small OM1).  LM lesion was likely culprit for MI.  Medical management.  - STEMI with STE V1/V2 in 2/19: LHC (in [place]) showed occluded LM, 99% pLAD, LIMA-LAD patent with severe disease after touchdown, SVG-D patent, SVG-PLOM totally occluded.  2. Asthma 3. HTN 4. Hyperlipidemia 5. GERD 6. Vertigo 5/16: Suspect BPPV.  Was hospitalized in [place].  MRI head negative for CVA, carotid dopplers without significant stenosis (per his report).  7. Sinus bradycardia 8. Ischemic cardiomyopathy: Echo (2/17) with EF 40-45%, akinesis of the inferolateral and inferior walls, PA systolic pressure 55 mmHg.  - Echo 12/17 showed EF 35-40%, basal to mid inferior/inferolateral akinesis, mild to moderate MR, mildly decreased RV systolic function.  - LV-gram (2/19) with EF 25%.  - Echo (2/19): EF 35% with akinetic basal to mid inferior and inferolateral walls and severely hypokinetic inferoseptal wall. Normal RV with mildly decreased systolic function.  9. VT arrest 2/19 with MI  SH: Widowed, lives in [place].  Has children.  Quit smoking around 1980.  Occasional ETOH.   FH: Parents and brother with CAD.   ROS: All systems reviewed and negative except per HPI.    Current Outpatient Medications  Medication  Sig Dispense Refill  . albuterol (PROVENTIL HFA) 108 (90 BASE) MCG/ACT inhaler Inhale 1 puff into the lungs as needed for wheezing or shortness of breath.     [name] aspirin 81 MG tablet Take 81 mg by mouth daily.      [name] atorvastatin (LIPITOR) 80 MG tablet Take 1 tablet (80 mg total) by mouth daily at 6 PM. 30 tablet 11  . carvedilol (COREG) 6.25 MG tablet Take 1 tablet (6.25 mg total) by mouth 2 (two) times daily. 60 tablet 3  . diphenhydrAMINE (BENADRYL) 25 MG tablet Take 25 mg by mouth at bedtime.    . fluticasone  (FLONASE) 50 MCG/ACT nasal spray Place 1 spray into both nostrils at bedtime. DAILY    . furosemide (LASIX) 20 MG tablet Take 1 tablet (20 mg total) by mouth daily. 30 tablet 3  . guaiFENesin (MUCINEX) 600 MG 12 hr tablet Take 600 mg by mouth 2 (two) times daily as needed for cough.    . isosorbide mononitrate (IMDUR) 30 MG 24 hr tablet Take 30 mg by mouth daily.    . mometasone (ASMANEX) 220 MCG/INH inhaler Inhale 2 puffs into the lungs daily.    . naproxen sodium (ANAPROX) 220 MG tablet Take 220 mg by mouth daily.      . pantoprazole (PROTONIX) 40 MG tablet Take 1 tablet (40 mg total) by mouth daily at 6 PM. 30 tablet 5  . sacubitril-valsartan (ENTRESTO) 49-51 MG Take 1 tablet by mouth 2 (two) times daily.    [name] spironolactone (ALDACTONE) 25 MG tablet TAKE 1 TABLET DAILY 30 tablet 3  . ticagrelor (BRILINTA) 90 MG TABS tablet Take 90 mg by mouth 2 (two) times daily.     No current facility-administered medications for this encounter.     BP 122/66   Pulse (!) 51   Wt 69.4 kg (153 lb)   SpO2 96%   BMI 23.26 kg/m  General: NAD Neck: No JVD, no thyromegaly or thyroid nodule.  Lungs: Clear to auscultation bilaterally with normal respiratory effort. CV: Nondisplaced PMI.  Heart regular S1/S2, no S3/S4, no murmur.  No peripheral edema.  No carotid bruit.  Normal pedal pulses.  Abdomen: Soft, nontender, no hepatosplenomegaly, no distention.  Skin: Intact without lesions or rashes.  Neurologic: Alert and oriented x 3.  Psych: Normal affect. Extremities: No clubbing or cyanosis.  HEENT: Normal.   Assessment/Plan: 1. CAD: S/p CABG.  NSTEMI 2/17 with culprit 99% subtotal occlusion of left main managed medically given small territory lost (small OM1) and very difficult for intervention.  Bypass grafts from CABG #2 were all patent. He had MI again in 2/19 with cardiac arrest.  LHC showed occluded LM with patent LIMA-LAD and SVG-D but occluded SVG-PLOM.  MI was thought to be due to left main  occlusion and he had DES from LM into LAD. I wonder, however, if the culprit was not occlusion of SVG-PLOM.   - Continue ASA 81.  - He will continue ticagrelor 90 mg bid for 1 year then decrease ticagrelor to 60 mg bid long-term.  - Continue atorvastatin 80 daily, good LDL in 4/19.   - Continue Imdur 30 mg daily, Coreg.  2. HTN: BP now on the lower side.    3. Hyperlipidemia: Good lipids in 4/19.    4. Ischemic cardiomyopathy: EF 35-40% on 2017 echo, EF 25% on LV-gram from cath in [place] in 2/19.  Echo in 2/19 showed EF 35% with wall motion abnormalities in the inferior, inferolateral, and  inferoseptal walls.  Today, he is not volume overloaded on exam and symptoms are NYHA class II, overall doing well.  BP ok in the office today but runs lower at home with occasional mild orthostatic symptoms.  I will not up-titrate his meds today.   - Continue Lasix 20 mg daily.   - Continue spironolactone 25 mg daily.  He has been using a K-based salt substitute and K in 7/19 was 5.1.  I asked him to stop using this.    - Continue Coreg 6.25 mg bid, was more lightheaded when I tried to increase it to 9.375 mg bid.  - Continue Entresto 49/51 bid.  - BMET today.  - I will have him followup with me in 2/19, will repeat an echo on the day of followup.     Ricardo AnconaDalton Severiano Reed 12/16/2017

## 2018-01-01 ENCOUNTER — Other Ambulatory Visit (HOSPITAL_COMMUNITY): Payer: Self-pay | Admitting: Cardiology

## 2018-02-13 IMAGING — XA Imaging study
1 series · 1 of 1 positions shown · non-contrast
Comparison: none

CLINICAL DATA: Lumbosacral spondylosis without myelopathy. RIGHT
leg radicular symptoms have resolved, now primarily lumbago.

[Series 2: ortho standard · 1 of 1 slices shown]
[im 1/1]
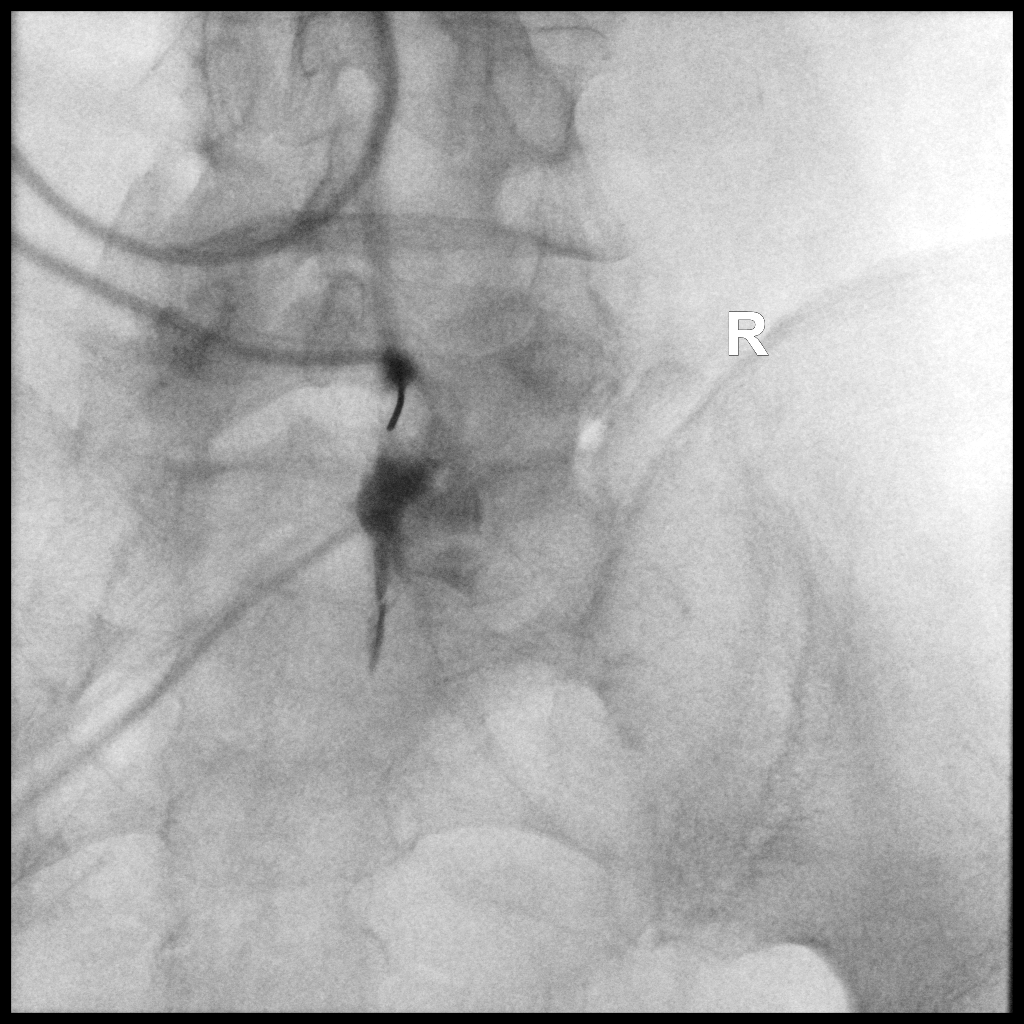

[1 of 1 positions shown; findings below may reference images not displayed]

FLUOROSCOPY TIME:  30 seconds corresponding to a Dose Area Product
of 63.95 ?Gy*m2

PROCEDURE:
The procedure, risks, benefits, and alternatives were explained to
the patient. Questions regarding the procedure were encouraged and
answered. The patient understands and consents to the procedure.
Outside imaging was reviewed.

LUMBAR EPIDURAL INJECTION:

An interlaminar approach was performed on RIGHT at L5-S1. The
overlying skin was cleansed and anesthetized. A 20 gauge epidural
needle was advanced using loss-of-resistance technique.

DIAGNOSTIC EPIDURAL INJECTION:

Injection of Isovue-M 200 shows a good epidural pattern with spread
above and below the level of needle placement, primarily on the
RIGHT; no vascular opacification is seen.

THERAPEUTIC EPIDURAL INJECTION:

120.0 Mg of Depo-Medrol mixed with 2 mL 1% lidocaine were instilled.
The procedure was well-tolerated, and the patient was discharged
thirty minutes following the injection in good condition.

COMPLICATIONS:
None.
IMPRESSION: Technically successful epidural injection on the RIGHT L5-S1 # 1.

## 2018-04-11 ENCOUNTER — Other Ambulatory Visit (HOSPITAL_COMMUNITY): Payer: Self-pay | Admitting: Cardiology

## 2018-05-08 ENCOUNTER — Other Ambulatory Visit (HOSPITAL_COMMUNITY): Payer: Self-pay | Admitting: Cardiology

## 2018-05-14 ENCOUNTER — Encounter (HOSPITAL_COMMUNITY): Payer: Self-pay | Admitting: Cardiology

## 2018-05-14 ENCOUNTER — Ambulatory Visit (HOSPITAL_BASED_OUTPATIENT_CLINIC_OR_DEPARTMENT_OTHER)
Admission: RE | Admit: 2018-05-14 | Discharge: 2018-05-14 | Disposition: A | Discharge status: Home / Self Care | Payer: Medicare Other | Source: Ambulatory Visit | Attending: Cardiology | Admitting: Cardiology

## 2018-05-14 ENCOUNTER — Ambulatory Visit (HOSPITAL_COMMUNITY)
Admission: RE | Admit: 2018-05-14 | Discharge: 2018-05-14 | Disposition: A | Discharge status: Home / Self Care | Payer: Medicare Other | Source: Ambulatory Visit | Attending: Cardiology | Admitting: Cardiology

## 2018-05-14 ENCOUNTER — Other Ambulatory Visit: Payer: Self-pay

## 2018-05-14 VITALS — BP 136/78 | HR 63 | Wt 156.6 lb

## 2018-05-14 DIAGNOSIS — I255 Ischemic cardiomyopathy: Secondary | ICD-10-CM | POA: Diagnosis not present

## 2018-05-14 DIAGNOSIS — I25810 Atherosclerosis of coronary artery bypass graft(s) without angina pectoris: Secondary | ICD-10-CM | POA: Insufficient documentation

## 2018-05-14 DIAGNOSIS — Z87891 Personal history of nicotine dependence: Secondary | ICD-10-CM | POA: Diagnosis not present

## 2018-05-14 DIAGNOSIS — Z7901 Long term (current) use of anticoagulants: Secondary | ICD-10-CM | POA: Diagnosis not present

## 2018-05-14 DIAGNOSIS — K219 Gastro-esophageal reflux disease without esophagitis: Secondary | ICD-10-CM | POA: Diagnosis not present

## 2018-05-14 DIAGNOSIS — Z8674 Personal history of sudden cardiac arrest: Secondary | ICD-10-CM | POA: Diagnosis not present

## 2018-05-14 DIAGNOSIS — Z79899 Other long term (current) drug therapy: Secondary | ICD-10-CM | POA: Insufficient documentation

## 2018-05-14 DIAGNOSIS — I2510 Atherosclerotic heart disease of native coronary artery without angina pectoris: Secondary | ICD-10-CM | POA: Insufficient documentation

## 2018-05-14 DIAGNOSIS — I10 Essential (primary) hypertension: Secondary | ICD-10-CM | POA: Diagnosis not present

## 2018-05-14 DIAGNOSIS — Z7982 Long term (current) use of aspirin: Secondary | ICD-10-CM | POA: Insufficient documentation

## 2018-05-14 DIAGNOSIS — E785 Hyperlipidemia, unspecified: Secondary | ICD-10-CM | POA: Diagnosis not present

## 2018-05-14 DIAGNOSIS — J45909 Unspecified asthma, uncomplicated: Secondary | ICD-10-CM | POA: Insufficient documentation

## 2018-05-14 DIAGNOSIS — I252 Old myocardial infarction: Secondary | ICD-10-CM | POA: Diagnosis not present

## 2018-05-14 LAB — LIPID PANEL
CHOL/HDL RATIO: 2.8 ratio
CHOLESTEROL: 94 mg/dL (ref 0–200)
HDL: 34 mg/dL — ABNORMAL LOW (ref >40)
LDL Cholesterol: 51 mg/dL (ref 0–99)
TRIGLYCERIDES: 47 mg/dL (ref <150)
VLDL: 9 mg/dL (ref 0–40)

## 2018-05-14 LAB — BASIC METABOLIC PANEL
ANION GAP: 12 (ref 5–15)
BUN: 12 mg/dL (ref 8–23)
CALCIUM: 9.4 mg/dL (ref 8.9–10.3)
CO2: 27 mmol/L (ref 22–32)
Chloride: 100 mmol/L (ref 98–111)
Creatinine, Ser: 0.89 mg/dL (ref 0.61–1.24)
Glucose, Bld: 136 mg/dL — ABNORMAL HIGH (ref 70–99)
Potassium: 4.1 mmol/L (ref 3.5–5.1)
Sodium: 139 mmol/L (ref 135–145)

## 2018-05-14 LAB — CBC
HCT: 42.9 % (ref 39.0–52.0)
Hemoglobin: 13.8 g/dL (ref 13.0–17.0)
MCH: 30.1 pg (ref 26.0–34.0)
MCHC: 32.2 g/dL (ref 30.0–36.0)
MCV: 93.5 fL (ref 80.0–100.0)
NRBC: 0 % (ref 0.0–0.2)
PLATELETS: 196 10*3/uL (ref 150–400)
RBC: 4.59 MIL/uL (ref 4.22–5.81)
RDW: 12.2 % (ref 11.5–15.5)
WBC: 8.5 10*3/uL (ref 4.0–10.5)

## 2018-05-14 MED ORDER — TICAGRELOR 60 MG PO TABS: 90.0000 mg | ORAL_TABLET | Freq: Two times a day (BID) | ORAL | 6 refills | Status: DC

## 2018-05-14 MED ORDER — TICAGRELOR 60 MG PO TABS: 60.0000 mg | ORAL_TABLET | Freq: Two times a day (BID) | ORAL | 6 refills | Status: AC

## 2018-05-14 NOTE — Patient Instructions (Addendum)
DECREASE Ticagrelor to 60mg  (1 tab) twice a day  Labs today We will only contact you if something comes back abnormal or we need to make some changes. Otherwise no news is good news!  Your physician recommends that you schedule a follow-up appointment in: 4 months with Dr. Shirlee Latch

## 2018-05-14 NOTE — Progress Notes (Signed)
  Echocardiogram 2D Echocardiogram with 3D has been performed.  Leta Jungling M 05/14/2018, 10:40 AM

## 2018-05-14 NOTE — Progress Notes (Signed)
Patient ID: Ricardo Reed, male   DOB: 07-May-1931, 85 y.o.   MRN: [id number] PCP: Dr. Kreg Shropshire Cardiology: Dr. Shirlee Latch  85 y.o. with history of CAD s/p CABG and redo CABG presents for cardiology followup.  He had his redo CABG in 5/10.  In 2/17, he had NSTEMI with LHC showing all the bypass grafts from CABG #2 patent, but he had culprit new 99% left main stenosis.  The LM was very short.  The territory at risk was very small due to patent bypass grafts.  PCI would have been extremely difficult, so he was managed medically.  Echo in 2/17 showed EF 40-45% with inferior/inferolateral akinesis (likely from a prior MI).  He was started on Plavix and Imdur.  Echo 12/17 showed EF 35-40%, basal to mid inferior/inferolateral akinesis, mild to moderate MR, mildly decreased RV systolic function.   In 2/19, he had STEMI (V1 and V2 STE) with cardiac arrest.  He had PCI to the left main into the proximal LAD with DES.  EF 25% on LV-gram.  He was treated in [place].   Echo in 2/19 shows EF 35% with akinetic basal to mid inferior and inferolateral walls and severely hypokinetic inferoseptal wall. Normal RV with mildly decreased systolic function.   Echo was done today and reviewed. EF 45-50%, normal RV.   He returns for followup of CHF and CAD.  Symptomatically he has been doing well.  No chest pain.  No exertional dyspnea.  He walks 0.5-1.5 miles/day with no problem.  He still golfs.  Rare lightheadedness with standing.  No syncope.  No orthopnea/PND.  Unfortunately, grandson passed away a few weeks ago.   Labs (6/14): LDL 51, HDL 35, K 4.1, creatinine 0.8, HCT 48 Labs (6/15): LDL 43, HDL 36.5, K 4.2, creatinine 0.9, HCT 46.7 Labs (2/17): K 4, creatinine 1.10, TSH normal, HCT 43 Labs (3/17): LDL 57 Labs (9/17): K 5, creatinine 0.92, BNP 174 Labs (1/19): LDL 52, HDL 34 Labs (2/19): K 4.5, creatinine 1.0 => 0.84, hgb 12.8 Labs (4/19): BNP 273, LDL 40, K 4, creatinine 0.9 Labs (7/19): K 5.1, creatinine  0.9  PMH: 1. CAD: CABG in 1980s.  NSTEMI in 2009.  Redo CABG in 5/10 => LIMA-LAD, SVG-D, SVG-OM.   - NSTEMI 2/17: LHC (2/17, [place]) with patent LIMA-LAD, SVG-PLOM, SVG-D, 99% stenosis short left main (new) but only small territory lost (small OM1).  LM lesion was likely culprit for MI.  Medical management.  - STEMI with STE V1/V2 in 2/19: LHC (in [place]) showed occluded LM, 99% pLAD, LIMA-LAD patent with severe disease after touchdown, SVG-D patent, SVG-PLOM totally occluded.  2. Asthma 3. HTN 4. Hyperlipidemia 5. GERD 6. Vertigo 5/16: Suspect BPPV.  Was hospitalized in [place].  MRI head negative for CVA, carotid dopplers without significant stenosis (per his report).  7. Sinus bradycardia 8. Ischemic cardiomyopathy: Echo (2/17) with EF 40-45%, akinesis of the inferolateral and inferior walls, PA systolic pressure 55 mmHg.  - Echo 12/17 showed EF 35-40%, basal to mid inferior/inferolateral akinesis, mild to moderate MR, mildly decreased RV systolic function.  - LV-gram (2/19) with EF 25%.  - Echo (2/19): EF 35% with akinetic basal to mid inferior and inferolateral walls and severely hypokinetic inferoseptal wall. Normal RV with mildly decreased systolic function.  - Echo (2/20): EF 45-50%, inferior/inferoseptal hypokinesis and inferolateral akinesis, mild MR.  9. VT arrest 2/19 with MI  SH: Widowed, lives in [place].  Has children.  Quit smoking around 1980.  Occasional ETOH.  FH: Parents and brother with CAD.   ROS: All systems reviewed and negative except per HPI.    Current Outpatient Medications  Medication Sig Dispense Refill  . albuterol (PROVENTIL HFA) 108 (90 BASE) MCG/ACT inhaler Inhale 1 puff into the lungs as needed for wheezing or shortness of breath.     [name]. aspirin 81 MG tablet Take 81 mg by mouth daily.      [name]. atorvastatin (LIPITOR) 80 MG tablet Take 1 tablet (80 mg total) by mouth daily at 6 PM. 30 tablet 11  . carvedilol (COREG) 6.25 MG tablet Take 1  tablet (6.25 mg total) by mouth 2 (two) times daily. 60 tablet 11  . diphenhydrAMINE (BENADRYL) 25 MG tablet Take 25 mg by mouth at bedtime.    . fluticasone (FLONASE) 50 MCG/ACT nasal spray Place 1 spray into both nostrils at bedtime. DAILY    . furosemide (LASIX) 20 MG tablet Take 1 tablet (20 mg total) by mouth daily. 30 tablet 0  . guaiFENesin (MUCINEX) 600 MG 12 hr tablet Take 600 mg by mouth 2 (two) times daily as needed for cough.    . isosorbide mononitrate (IMDUR) 30 MG 24 hr tablet Take 30 mg by mouth daily.    . mometasone (ASMANEX) 220 MCG/INH inhaler Inhale 2 puffs into the lungs daily.    . naproxen sodium (ANAPROX) 220 MG tablet Take 220 mg by mouth daily.      . pantoprazole (PROTONIX) 40 MG tablet Take 1 tablet (40 mg total) by mouth daily at 6 PM. 30 tablet 5  . sacubitril-valsartan (ENTRESTO) 49-51 MG Take 1 tablet by mouth 2 (two) times daily.    [name]. spironolactone (ALDACTONE) 25 MG tablet TAKE 1 TABLET DAILY 30 tablet 0  . ticagrelor (BRILINTA) 60 MG TABS tablet Take 1 tablet (60 mg total) by mouth 2 (two) times daily. 60 tablet 6   No current facility-administered medications for this encounter.     BP 136/78   Pulse 63   Wt 71 kg (156 lb 9.6 oz)   SpO2 97%   BMI 23.81 kg/m  General: NAD Neck: No JVD, no thyromegaly or thyroid nodule.  Lungs: Clear to auscultation bilaterally with normal respiratory effort. CV: Nondisplaced PMI.  Heart regular S1/S2, no S3/S4, no murmur.  No peripheral edema.  No carotid bruit.  Normal pedal pulses.  Abdomen: Soft, nontender, no hepatosplenomegaly, no distention.  Skin: Intact without lesions or rashes.  Neurologic: Alert and oriented x 3.  Psych: Normal affect. Extremities: No clubbing or cyanosis.  HEENT: Normal.   Assessment/Plan: 1. CAD: S/p CABG.  NSTEMI 2/17 with culprit 99% subtotal occlusion of left main managed medically given small territory lost (small OM1) and very difficult for intervention.  Bypass grafts from CABG  #2 were all patent. He had MI again in 2/19 with cardiac arrest.  LHC showed occluded LM with patent LIMA-LAD and SVG-D but occluded SVG-PLOM.  MI was thought to be due to left main occlusion and he had DES from LM into LAD. I wonder, however, if the culprit was not occlusion of SVG-PLOM.  No further chest pain.  - Continue ASA 81.  - He is now a year out from MI/PCI.  Can decrease ticagrelor to 60 mg bid for long-term.  - Continue atorvastatin 80 daily, check lipids today.   - Continue Imdur 30 mg daily, Coreg.  2. HTN: BP stable.     3. Hyperlipidemia: Check today.     4. Ischemic cardiomyopathy: EF 35-40%  on 2017 echo, EF 25% on LV-gram from cath in [place] in 2/19.  Echo in 2/19 showed EF 35% with wall motion abnormalities in the inferior, inferolateral, and inferoseptal walls.  Today's echo was reviewed, EF up to 45-50% with wall motion abnormalities listed above.  NYHA class II symptoms, not volume overloaded.   - Continue Lasix 20 mg daily.  BMET today.  - Continue spironolactone 25 mg daily.   - Continue Coreg 6.25 mg bid, was more lightheaded when I tried to increase it to 9.375 mg bid.  - Continue Entresto 49/51 bid.   I will have him see me in 6 months.  He needs to be seen by PCP in between visits with me for BMET.   [name] 05/14/2018

## 2018-05-20 ENCOUNTER — Other Ambulatory Visit (HOSPITAL_COMMUNITY): Payer: Self-pay | Admitting: Cardiology

## 2018-06-22 ENCOUNTER — Other Ambulatory Visit (HOSPITAL_COMMUNITY): Payer: Self-pay | Admitting: Cardiology

## 2018-10-14 ENCOUNTER — Other Ambulatory Visit (HOSPITAL_COMMUNITY): Payer: Self-pay | Admitting: Cardiology

## 2018-11-12 ENCOUNTER — Other Ambulatory Visit (HOSPITAL_COMMUNITY): Payer: Self-pay | Admitting: Cardiology

## 2018-12-19 ENCOUNTER — Other Ambulatory Visit (HOSPITAL_COMMUNITY): Payer: Self-pay | Admitting: Cardiology

## 2018-12-26 ENCOUNTER — Telehealth (HOSPITAL_COMMUNITY): Payer: Self-pay | Admitting: *Deleted

## 2018-12-26 NOTE — Telephone Encounter (Signed)
Last echo report faxed to [facility] as requested by patient.

## 2019-01-26 DEATH — deceased
